# Patient Record
Sex: Female | Born: 1990 | Race: White | Hispanic: No | Marital: Single | Smoking: Former smoker
Health system: Southern US, Community
[De-identification: ages and names within clinical notes are randomized; demographics above are authoritative.]

## PROBLEM LIST (undated history)

## (undated) ENCOUNTER — Inpatient Hospital Stay (HOSPITAL_COMMUNITY): Payer: Self-pay

## (undated) DIAGNOSIS — N7011 Chronic salpingitis: Secondary | ICD-10-CM

## (undated) DIAGNOSIS — R519 Headache, unspecified: Secondary | ICD-10-CM

## (undated) DIAGNOSIS — O24419 Gestational diabetes mellitus in pregnancy, unspecified control: Secondary | ICD-10-CM

## (undated) DIAGNOSIS — E282 Polycystic ovarian syndrome: Secondary | ICD-10-CM

## (undated) DIAGNOSIS — R51 Headache: Secondary | ICD-10-CM

## (undated) DIAGNOSIS — F909 Attention-deficit hyperactivity disorder, unspecified type: Secondary | ICD-10-CM

## (undated) HISTORY — DX: Gestational diabetes mellitus in pregnancy, unspecified control: O24.419

## (undated) HISTORY — PX: TONSILLECTOMY: SUR1361

---

## 2011-07-14 ENCOUNTER — Ambulatory Visit (INDEPENDENT_AMBULATORY_CARE_PROVIDER_SITE_OTHER): Payer: BC Managed Care – PPO

## 2011-07-14 DIAGNOSIS — J019 Acute sinusitis, unspecified: Secondary | ICD-10-CM

## 2011-07-14 DIAGNOSIS — J209 Acute bronchitis, unspecified: Secondary | ICD-10-CM

## 2011-07-14 DIAGNOSIS — F17200 Nicotine dependence, unspecified, uncomplicated: Secondary | ICD-10-CM

## 2011-12-13 ENCOUNTER — Ambulatory Visit (INDEPENDENT_AMBULATORY_CARE_PROVIDER_SITE_OTHER): Payer: BC Managed Care – PPO | Admitting: Emergency Medicine

## 2011-12-13 VITALS — BP 104/63 | HR 66 | Temp 98.4°F | Resp 18 | Ht 62.0 in | Wt 160.6 lb

## 2011-12-13 DIAGNOSIS — G8929 Other chronic pain: Secondary | ICD-10-CM

## 2011-12-13 DIAGNOSIS — R112 Nausea with vomiting, unspecified: Secondary | ICD-10-CM

## 2011-12-13 DIAGNOSIS — R1011 Right upper quadrant pain: Secondary | ICD-10-CM

## 2011-12-13 LAB — POCT URINALYSIS DIPSTICK
Bilirubin, UA: NEGATIVE
Blood, UA: NEGATIVE
Glucose, UA: NEGATIVE
Ketones, UA: NEGATIVE
Nitrite, UA: NEGATIVE
pH, UA: 7

## 2011-12-13 LAB — POCT UA - MICROSCOPIC ONLY
Bacteria, U Microscopic: NEGATIVE
Casts, Ur, LPF, POC: NEGATIVE
Crystals, Ur, HPF, POC: NEGATIVE
Mucus, UA: NEGATIVE
RBC, urine, microscopic: NEGATIVE

## 2011-12-13 MED ORDER — ONDANSETRON 4 MG PO TBDP: 4.0000 mg | ORAL_TABLET | Freq: Three times a day (TID) | ORAL | Status: AC | PRN

## 2011-12-13 NOTE — Progress Notes (Signed)
  Subjective:    Patient ID: Angela Willis, female    DOB: 1990-07-16, 21 y.o.   MRN: [id number]  Abdominal Pain This is a recurrent problem. The current episode started more than 1 month ago. The onset quality is gradual. The problem occurs daily. The problem has been gradually worsening. The pain is located in the RUQ. The pain is mild. The quality of the pain is colicky. The abdominal pain does not radiate. Associated symptoms include nausea and vomiting. Pertinent negatives include no anorexia, arthralgias, belching, constipation, diarrhea, dysuria, fever, flatus, frequency, headaches, hematochezia, hematuria, melena, myalgias or weight loss. The pain is aggravated by eating. The pain is relieved by nothing. She has tried nothing for the symptoms. There is no history of abdominal surgery, colon cancer, Crohn's disease, gallstones, GERD, irritable bowel syndrome, pancreatitis, PUD or ulcerative colitis.  Emesis  Associated symptoms include abdominal pain. Pertinent negatives include no arthralgias, diarrhea, fever, headaches, myalgias or weight loss.      Review of Systems  Constitutional: Negative.  Negative for fever and weight loss.  HENT: Negative.   Eyes: Negative.   Respiratory: Negative.   Cardiovascular: Negative.   Gastrointestinal: Positive for nausea, vomiting and abdominal pain. Negative for diarrhea, constipation, melena, hematochezia, anorexia and flatus.  Genitourinary: Negative.  Negative for dysuria, frequency and hematuria.  Musculoskeletal: Negative.  Negative for myalgias and arthralgias.  Neurological: Negative for headaches.       Objective:   Physical Exam  Nursing note and vitals reviewed. Constitutional: She is oriented to person, place, and time. She appears well-developed and well-nourished.  HENT:  Head: Normocephalic and atraumatic.  Left Ear: External ear normal.  Mouth/Throat: Oropharynx is clear and moist.  Eyes: Conjunctivae and EOM are normal.  Pupils are equal, round, and reactive to light. No scleral icterus.  Neck: Normal range of motion. Neck supple.  Cardiovascular: Normal rate, regular rhythm and normal heart sounds.   Pulmonary/Chest: Effort normal.  Abdominal: Soft. There is tenderness (murphy positive).  Musculoskeletal: Normal range of motion.  Neurological: She is alert and oriented to person, place, and time.  Skin: Skin is warm and dry.          Assessment & Plan:  Will obtain gb ultrasound   Counseled regarding birth control.  Results for orders placed in visit on 12/13/11  POCT URINE PREGNANCY      Component Value Range   Preg Test, Ur Negative    POCT URINALYSIS DIPSTICK      Component Value Range   Color, UA yellow     Clarity, UA clear     Glucose, UA neg     Bilirubin, UA neg     Ketones, UA neg     Spec Grav, UA 1.015     Blood, UA neg     pH, UA 7.0     Protein, UA neg     Urobilinogen, UA 0.2     Nitrite, UA neg     Leukocytes, UA Negative    POCT UA - MICROSCOPIC ONLY      Component Value Range   WBC, Ur, HPF, POC 0-1     RBC, urine, microscopic neg     Bacteria, U Microscopic neg     Mucus, UA neg     Epithelial cells, urine per micros 0-4     Crystals, Ur, HPF, POC neg     Casts, Ur, LPF, POC neg     Yeast, UA neg

## 2011-12-19 ENCOUNTER — Ambulatory Visit
Admission: RE | Admit: 2011-12-19 | Discharge: 2011-12-19 | Disposition: A | Discharge status: Home / Self Care | Payer: BC Managed Care – PPO | Source: Ambulatory Visit | Attending: Emergency Medicine | Admitting: Emergency Medicine

## 2011-12-19 ENCOUNTER — Other Ambulatory Visit: Payer: Self-pay | Admitting: Emergency Medicine

## 2011-12-19 DIAGNOSIS — R112 Nausea with vomiting, unspecified: Secondary | ICD-10-CM

## 2011-12-19 DIAGNOSIS — R1011 Right upper quadrant pain: Secondary | ICD-10-CM

## 2011-12-19 DIAGNOSIS — G8929 Other chronic pain: Secondary | ICD-10-CM

## 2011-12-26 ENCOUNTER — Other Ambulatory Visit: Payer: Self-pay | Admitting: Emergency Medicine

## 2011-12-26 ENCOUNTER — Telehealth: Payer: Self-pay

## 2011-12-26 NOTE — Telephone Encounter (Signed)
Pt is requesting the results of her scan Dr. Dareen Piano ordered.  Please call

## 2011-12-26 NOTE — Telephone Encounter (Signed)
Dr Dareen Piano, can you please comment on pt's [place] results and if you want any f/up, etc?

## 2011-12-27 ENCOUNTER — Telehealth: Payer: Self-pay

## 2011-12-27 NOTE — Telephone Encounter (Signed)
Dr Dareen Piano, can you please review pt's labs so that she can be called?

## 2011-12-27 NOTE — Telephone Encounter (Signed)
PT ANXIOUS ABOUT TEST RESULTIS,WAS TOLD SHE WOULD RECEIVE CALL YESTERDAY   BEST PHONE [phone]

## 2016-08-29 ENCOUNTER — Ambulatory Visit (INDEPENDENT_AMBULATORY_CARE_PROVIDER_SITE_OTHER): Payer: Commercial Managed Care - HMO

## 2016-08-29 ENCOUNTER — Ambulatory Visit (INDEPENDENT_AMBULATORY_CARE_PROVIDER_SITE_OTHER): Payer: Commercial Managed Care - HMO | Admitting: Family Medicine

## 2016-08-29 VITALS — BP 122/80 | HR 86 | Temp 99.9°F | Resp 18 | Ht 62.0 in | Wt 178.0 lb

## 2016-08-29 DIAGNOSIS — M25562 Pain in left knee: Secondary | ICD-10-CM

## 2016-08-29 DIAGNOSIS — T07XXXA Unspecified multiple injuries, initial encounter: Secondary | ICD-10-CM

## 2016-08-29 DIAGNOSIS — M79642 Pain in left hand: Secondary | ICD-10-CM

## 2016-08-29 NOTE — Patient Instructions (Addendum)
Continue to cleanse abrasions with soap and water, Polysporin or Neosporin if needed to areas and watch for signs of infection as we discussed. If new areas of pain over the next few days that were not discussed today, or any worsening of pain in areas, return to recheck.  Tylenol over-the-counter or occasional Advil okay if needed for pain., Ice over affected areas up to 10 minutes to 15 minutes at a time if needed.   Return to the clinic or go to the nearest emergency room if any of your symptoms worsen or new symptoms occur.   Abrasion An abrasion is a cut or scrape on the outer surface of your skin. An abrasion does not extend through all of the layers of your skin. It is important to care for your abrasion properly to prevent infection. What are the causes? Most abrasions are caused by falling on or gliding across the ground or another surface. When your skin rubs on something, the outer and inner layer of skin rubs off. What are the signs or symptoms? A cut or scrape is the main symptom of this condition. The scrape may be bleeding, or it may appear red or pink. If there was an associated fall, there may be an underlying bruise. How is this diagnosed? An abrasion is diagnosed with a physical exam. How is this treated? Treatment for this condition depends on how large and deep the abrasion is. Usually, your abrasion will be cleaned with water and mild soap. This removes any dirt or debris that may be stuck. An antibiotic ointment may be applied to the abrasion to help prevent infection. A bandage (dressing) may be placed on the abrasion to keep it clean. You may also need a tetanus shot. Follow these instructions at home: Medicines   Take or apply medicines only as directed by your health care provider.  If you were prescribed an antibiotic ointment, finish all of it even if you start to feel better. Wound care   Clean the wound with mild soap and water 2-3 times per day or as  directed by your health care provider. Pat your wound dry with a clean towel. Do not rub it.  There are many different ways to close and cover a wound. Follow instructions from your health care provider about:  Wound care.  Dressing changes and removal.  Check your wound every day for signs of infection. Watch for:  Redness, swelling, or pain.  Fluid, blood, or pus. General instructions    Keep the dressing dry as directed by your health care provider. Do not take baths, swim, use a hot tub, or do anything that would put your wound underwater until your health care provider approves.  If there is swelling, raise (elevate) the injured area above the level of your heart while you are sitting or lying down.  Keep all follow-up visits as directed by your health care provider. This is important. Contact a health care provider if:  You received a tetanus shot and you have swelling, severe pain, redness, or bleeding at the injection site.  Your pain is not controlled with medicine.  You have increased redness, swelling, or pain at the site of your wound. Get help right away if:  You have a red streak going away from your wound.  You have a fever.  You have fluid, blood, or pus coming from your wound.  You notice a bad smell coming from your wound or your dressing. This information is not intended  to replace advice given to you by your health care provider. Make sure you discuss any questions you have with your health care provider. Document Released: 03/23/2005 Document Revised: 02/12/2016 Document Reviewed: 06/11/2014 Elsevier Interactive Patient Education  2017 [employer].    IF you received an x-ray today, you will receive an invoice from [facility]. Please contact [facility] at [phone] with questions or concerns regarding your invoice.   IF you received labwork today, you will receive an invoice from [facility]. Please contact [facility] at  [phone] with questions or concerns regarding your invoice.   Our billing staff will not be able to assist you with questions regarding bills from these companies.  You will be contacted with the lab results as soon as they are available. The fastest way to get your results is to activate your My Chart account. Instructions are located on the last page of this paperwork. If you have not heard from [place] regarding the results in 2 weeks, please contact this office.

## 2016-08-29 NOTE — Progress Notes (Signed)
Subjective:  By signing my name below, I, Essence Howell, attest that this documentation has been prepared under the direction and in the presence of Shade Flood, MD Electronically Signed: Charline Bills, ED Scribe 08/29/2016 at 2:32 PM.   Patient ID: Angela Willis, female    DOB: [birth year], 26 y.o.   MRN: [id number]  Chief Complaint  Patient presents with  . Hand Injury    INJURED LEFT HAND BREAKING UP DOGS  . Arm Pain    RIGHT ARM TOWARDS ELBOW   HPI Angela Willis is a 26 y.o. female who presents to Primary Care at [facility] complaining of a hand injury sustained yesterday. 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 Pt does not recall feeling a pop, snap or click in her left hand during the incident. She reports multiple abrasions to her right left hand, right hand, right forearm, both knees as well as swelling and bruising to her left hand. Pt was treated with saline by EMS on the scene. No active bleeding at this time. She has applied neosporin to the areas. Pt is right hand dominant.   There are no active problems to display for this patient.  History reviewed. No pertinent past medical history. History reviewed. No pertinent surgical history. Allergies  Allergen Reactions  . Hydrocodone Nausea Only   Prior to Admission medications   Not on File   Social History   Social History  . Marital status: Single    Spouse name: N/A  . Number of children: N/A  . Years of education: N/A   Occupational History  . Not on file.   Social History Main Topics  . Smoking status: Current Every Day Smoker    Packs/day: 3.00    Types: Cigarettes  . Smokeless tobacco: Never Used  . Alcohol use No  . Drug use: No  . Sexual activity: Yes   Other Topics Concern  . Not on file   Social History Narrative  . No narrative on file   Review of Systems  Musculoskeletal: Positive  for joint swelling.  Skin: Positive for color change and wound.      Objective:   Physical Exam  Constitutional: She is oriented to person, place, and time. She appears well-developed and well-nourished. No distress.  HENT:  Head: Normocephalic and atraumatic.  Eyes: Conjunctivae and EOM are normal.  Neck: Neck supple. No tracheal deviation present.  Cardiovascular: Normal rate.   Pulmonary/Chest: Effort normal. No respiratory distress.  Musculoskeletal: Normal range of motion.  Tenderness at the base 4th MCP of the L hand. Flexion and extension intact of the digits. Remainder of hand and wrist without bony tenderness.  FROM, pain free at L and R wrist. FROM, pain free at bilateral elbows. Full grip strength on R hand without bony tenderness. Superficial abrasion noted over the R forearm. L ulnar aspect of wrist and 4th and 5th dorsal fingers R hand small abrasions L grater than R anterior knees. R knee: no effusion. Skin intact other than superificial abrasion. Negative varus and valgus. L knee: FROM. Medial anterior joint line tenderness but no effusion. Negative Lachman.  Neurological: She is alert and oriented to person, place, and time.  Skin: Skin is warm and dry.  Psychiatric: She has a normal mood and affect. Her behavior is normal.  Nursing note and vitals reviewed.  Vitals:   08/29/16 1420  BP:  122/80  Pulse: 86  Resp: 18  Temp: 99.9 F (37.7 C)  TempSrc: Oral  SpO2: 97%  Weight: 178 lb (80.7 kg)  Height: 5\' 2"  (1.575 m)   Dg Knee Complete 4 Views Left  Result Date: 08/29/2016 CLINICAL DATA:  Medial left knee pain after a fall yesterday. EXAM: LEFT KNEE - COMPLETE 4+ VIEW COMPARISON:  None. FINDINGS: No evidence of fracture, dislocation, or joint effusion. No evidence of arthropathy or other focal bone abnormality. Soft tissues are unremarkable. IMPRESSION: Negative. Electronically Signed   By: [name] M.D.   On: 08/29/2016 15:12   Dg Hand Complete Left  Result  Date: 08/29/2016 CLINICAL DATA:  Fourth metacarpal pain after fall yesterday. EXAM: LEFT HAND - COMPLETE 3+ VIEW COMPARISON:  None. FINDINGS: There is no evidence of fracture or dislocation. There is no evidence of arthropathy or other focal bone abnormality. Mild dorsal hand soft tissue swelling without subcutaneous gas or radiopaque foreign bodies. IMPRESSION: Soft tissue swelling without acute osseous process. Electronically Signed   By: Awilda Metro M.D.   On: 08/29/2016 15:13      Assessment & Plan:   Angela Willis is a 26 y.o. female Abrasion, multiple sites  Left hand pain - Plan: DG Hand Complete Left  Acute pain of left knee - Plan: DG Knee Complete 4 Views Left  Abrasions of multiple areas of left wrist/hand, right hand and right forearm, without signs of infection. Possible contusion versus strain of MCP at left hand without apparent fracture. Flexion and extension intact throughout all digits, and overall reassuring exam. Knee x-ray without acute fracture, likely contusion with overlying abrasions.  -Symptomatic care discussed with soap and water cleansing, Polysporin or Neosporin to abrasions and handout given on abrasions.  -Over-the-counter NSAID if needed, ice over the contused areas as needed for the next few days, and RTC precautions discussed if any worsening.  No orders of the defined types were placed in this encounter.  Patient Instructions    Continue to cleanse abrasions with soap and water, Polysporin or Neosporin if needed to areas and watch for signs of infection as we discussed. If new areas of pain over the next few days that were not discussed today, or any worsening of pain in areas, return to recheck.  Tylenol over-the-counter or occasional Advil okay if needed for pain., Ice over affected areas up to 10 minutes to 15 minutes at a time if needed.   Return to the clinic or go to the nearest emergency room if any of your symptoms worsen or new symptoms  occur.   Abrasion An abrasion is a cut or scrape on the outer surface of your skin. An abrasion does not extend through all of the layers of your skin. It is important to care for your abrasion properly to prevent infection. What are the causes? Most abrasions are caused by falling on or gliding across the ground or another surface. When your skin rubs on something, the outer and inner layer of skin rubs off. What are the signs or symptoms? A cut or scrape is the main symptom of this condition. The scrape may be bleeding, or it may appear red or pink. If there was an associated fall, there may be an underlying bruise. How is this diagnosed? An abrasion is diagnosed with a physical exam. How is this treated? Treatment for this condition depends on how large and deep the abrasion is. Usually, your abrasion will be cleaned with water and mild soap. This  removes any dirt or debris that may be stuck. An antibiotic ointment may be applied to the abrasion to help prevent infection. A bandage (dressing) may be placed on the abrasion to keep it clean. You may also need a tetanus shot. Follow these instructions at home: Medicines   Take or apply medicines only as directed by your health care provider.  If you were prescribed an antibiotic ointment, finish all of it even if you start to feel better. Wound care   Clean the wound with mild soap and water 2-3 times per day or as directed by your health care provider. Pat your wound dry with a clean towel. Do not rub it.  There are many different ways to close and cover a wound. Follow instructions from your health care provider about:  Wound care.  Dressing changes and removal.  Check your wound every day for signs of infection. Watch for:  Redness, swelling, or pain.  Fluid, blood, or pus. General instructions    Keep the dressing dry as directed by your health care provider. Do not take baths, swim, use a hot tub, or do anything that would  put your wound underwater until your health care provider approves.  If there is swelling, raise (elevate) the injured area above the level of your heart while you are sitting or lying down.  Keep all follow-up visits as directed by your health care provider. This is important. Contact a health care provider if:  You received a tetanus shot and you have swelling, severe pain, redness, or bleeding at the injection site.  Your pain is not controlled with medicine.  You have increased redness, swelling, or pain at the site of your wound. Get help right away if:  You have a red streak going away from your wound.  You have a fever.  You have fluid, blood, or pus coming from your wound.  You notice a bad smell coming from your wound or your dressing. This information is not intended to replace advice given to you by your health care provider. Make sure you discuss any questions you have with your health care provider. Document Released: 03/23/2005 Document Revised: 02/12/2016 Document Reviewed: 06/11/2014 Elsevier Interactive Patient Education  2017 [employer]    IF you received an x-ray today, you will receive [facility]. Please contact [facility] at [phone] with questions or concerns regarding your invoice.   IF you received labwork today, you will receive an invoice from [facility]. Please contact [facility] at [phone] with questions or concerns regarding your invoice.   Our billing staff will not be able to assist you with questions regarding bills from these companies.  You will be contacted with the lab results as soon as they are available. The fastest way to get your results is to activate your My Chart account. Instructions are located on the last page of this paperwork. If you have not heard from us regarding the results in 2 weeks, please contact this office.      I personally performed the services described in this  documentation, which was scribed in my presence. The recorded information has been reviewed and considered for accuracy and completeness, addended by me as needed, and agree with information above.  Signed,   Meredith StaggersJeffrey Azhia Siefken, MD Primary Care at [facility].  08/31/16 11:38 AM

## 2017-03-27 ENCOUNTER — Other Ambulatory Visit (HOSPITAL_COMMUNITY): Payer: Self-pay | Admitting: Obstetrics and Gynecology

## 2017-03-27 DIAGNOSIS — N979 Female infertility, unspecified: Secondary | ICD-10-CM

## 2017-03-31 ENCOUNTER — Ambulatory Visit (HOSPITAL_COMMUNITY)
Admission: RE | Admit: 2017-03-31 | Discharge: 2017-03-31 | Disposition: A | Discharge status: Home / Self Care | Payer: BLUE CROSS/BLUE SHIELD | Source: Ambulatory Visit | Attending: Obstetrics and Gynecology | Admitting: Obstetrics and Gynecology

## 2017-03-31 DIAGNOSIS — N979 Female infertility, unspecified: Secondary | ICD-10-CM | POA: Insufficient documentation

## 2017-03-31 MED ORDER — IOPAMIDOL (ISOVUE-300) INJECTION 61%
30.0000 mL | Freq: Once | INTRAVENOUS | Status: AC | PRN
  Administered 2017-03-31: 11 mL

## 2017-06-30 IMAGING — DX DG KNEE COMPLETE 4+V*L*
4 series · 4 of 4 positions shown · non-contrast
Comparison: None.

CLINICAL DATA: Medial left knee pain after a fall yesterday.

EXAM:
LEFT KNEE - COMPLETE 4+ VIEW

[knee ap]
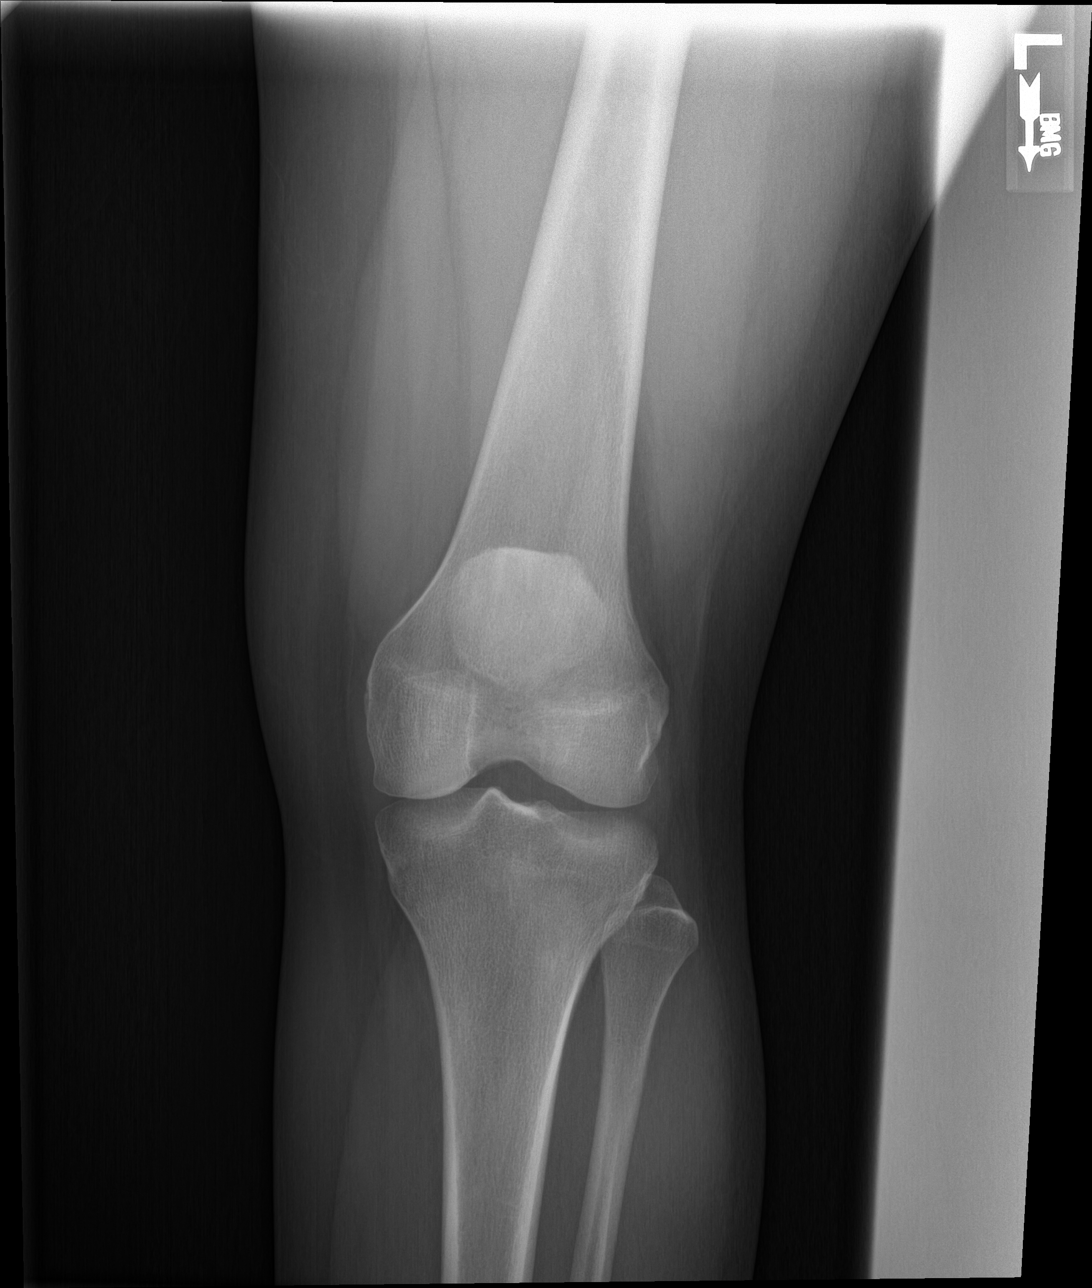

[knee lat]
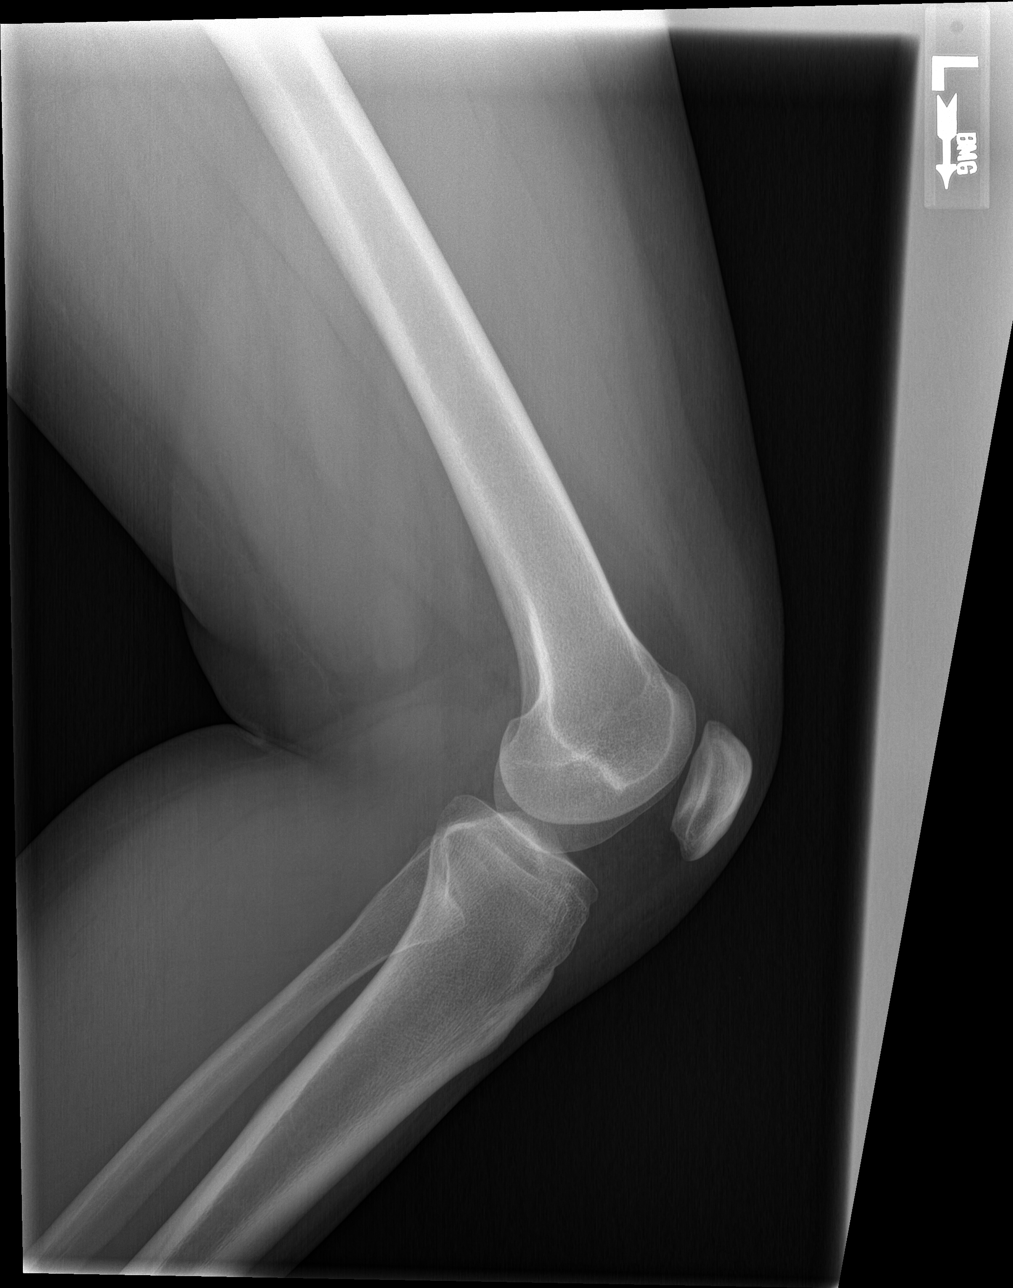

[sunrise]
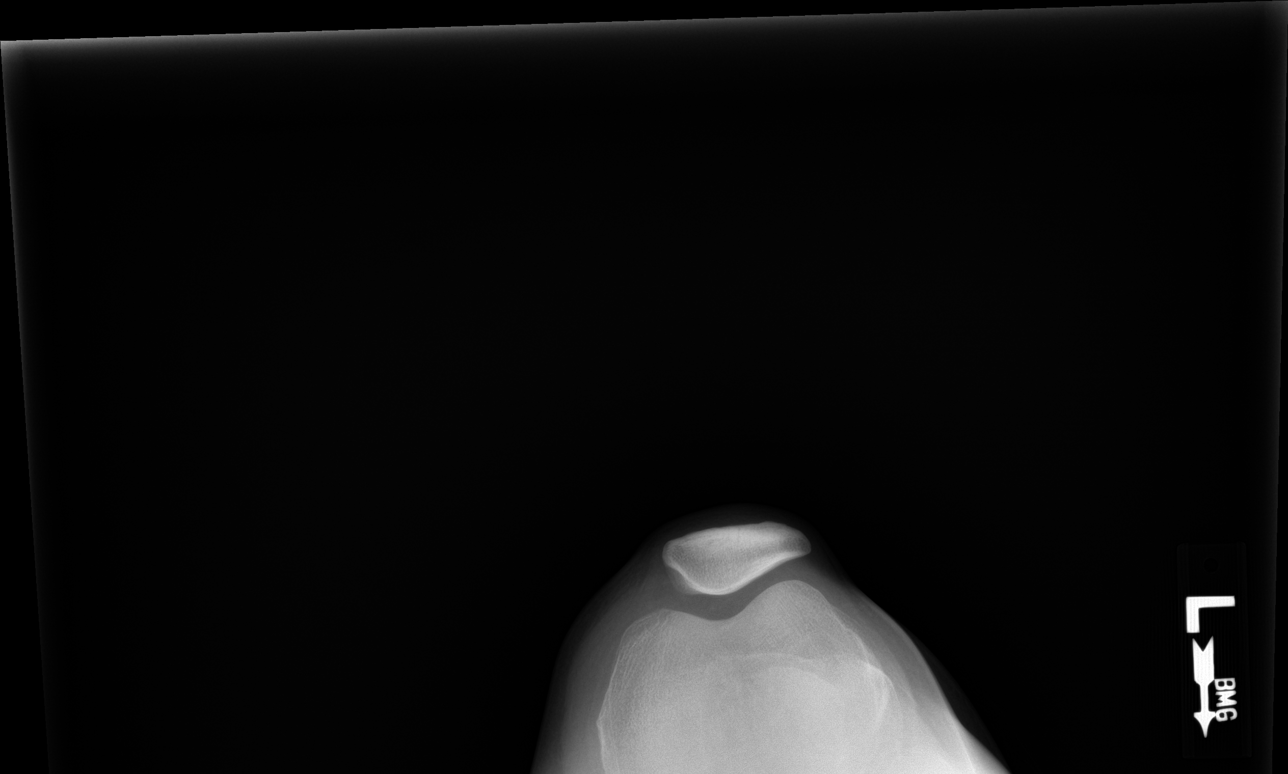

[knee [person_name]]
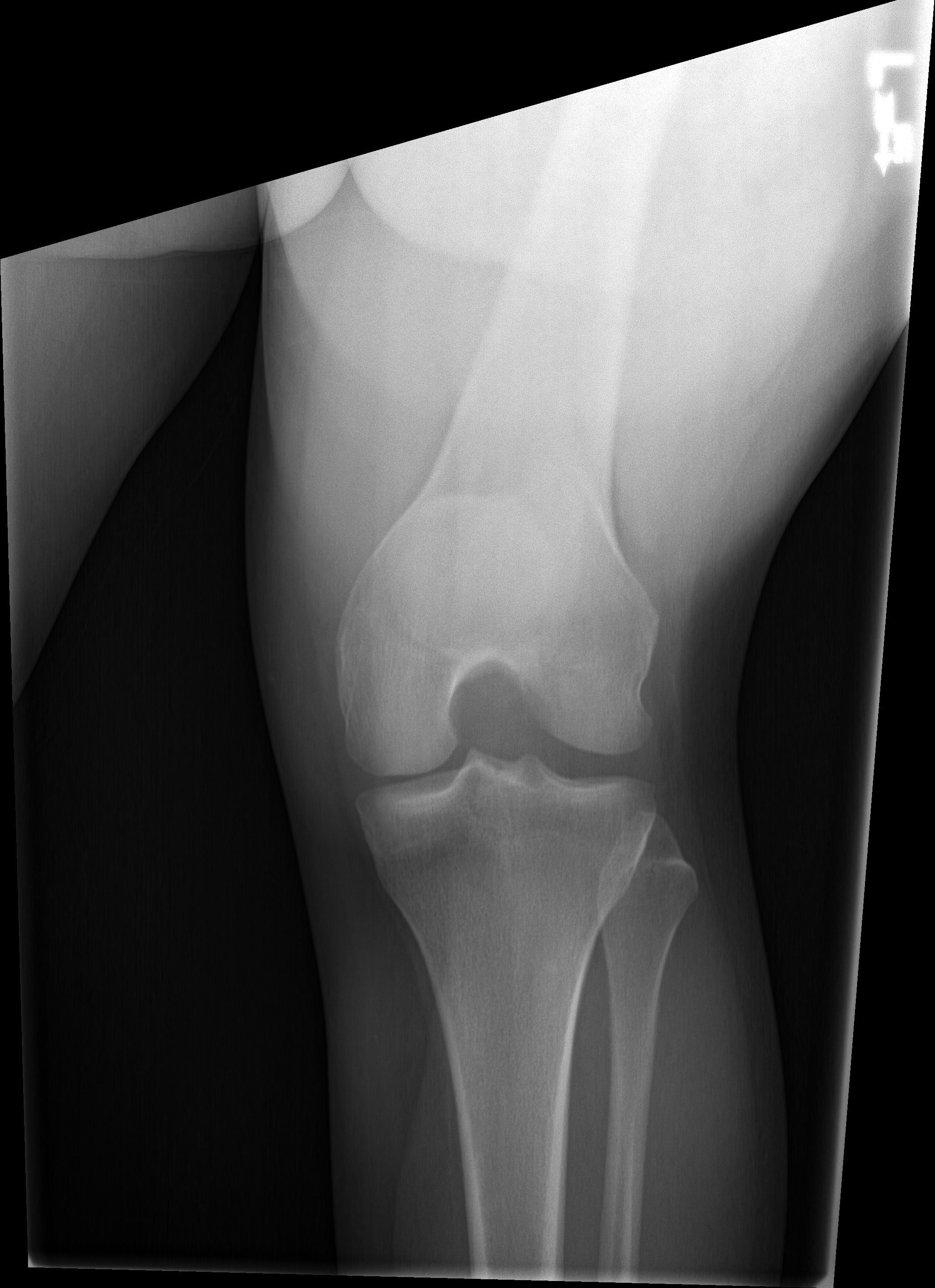

[4 of 4 positions shown; findings below may reference images not displayed]

FINDINGS: No evidence of fracture, dislocation, or joint effusion. No evidence
of arthropathy or other focal bone abnormality. Soft tissues are
unremarkable.
IMPRESSION: Negative.

## 2017-09-21 ENCOUNTER — Encounter (HOSPITAL_BASED_OUTPATIENT_CLINIC_OR_DEPARTMENT_OTHER): Payer: Self-pay | Admitting: Emergency Medicine

## 2017-09-21 ENCOUNTER — Other Ambulatory Visit: Payer: Self-pay

## 2017-09-21 NOTE — Progress Notes (Signed)
SPOKE WITH: patient  RIDING HOME WITH: mother Misty StanleyLisa [phone] AM MEDICATIONS: none  NPO STATUS: npo after midnight including no mint gum candy  LABS: needs hemoglobin and urine pregnancy DOS COMMENTS/CONCERNS: n/a

## 2017-09-28 NOTE — H&P (Addendum)
Angela CantorBrittany Goswick is a 27 y.o. female , originally referred to me by Dr. Olivia Mackieichard Taavon, for L/S, L Hydrosalpinx possible salpingectomy.  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 Patient would like to preserve her childbearing potential.  Pertinent Gynecological History: Menses: flow is excessive with use of 3 pads or tampons on heaviest days Bleeding: dysfunctional uterine bleeding Contraception: none DES exposure: denies Blood transfusions: none Sexually transmitted diseases: no past history Previous GYN Procedures: none  Last pap: normal  OB History: G1P0010   Menstrual History: Menarche age: [age] No LMP recorded.    Past Medical History:  Diagnosis Date  . ADHD    dx in childhood   . Chronic salpingitis    left   . Headache    occ   . PCOS (polycystic ovarian syndrome)                     Past Surgical History:  Procedure Laterality Date  . TONSILLECTOMY  age 40              History reviewed. No pertinent family history. No hereditary disease.  No cancer of breast, ovary, uterus. No cutaneous leiomyomatosis or renal cell carcinoma.  Social History   Socioeconomic History  . Marital status: Single    Spouse name: Not on file  . Number of children: Not on file  . Years of education: Not on file  . Highest education level: Not on file  Occupational History  . Not on file  Social Needs  . Financial resource strain: Not on file  . Food insecurity:    Worry: Not on file    Inability: Not on file  . Transportation needs:    Medical: Not on file    Non-medical: Not on file  Tobacco Use  . Smoking status: Former Smoker    Packs/day: 3.00    Years: 4.00    Pack years: 12.00    Types: Cigarettes    Last attempt to quit: 09/22/2014    Years since quitting: 3.0  . Smokeless tobacco: Never Used  Substance and Sexual Activity  . Alcohol use: No  . Drug use: No  . Sexual activity: Yes   Lifestyle  . Physical activity:    Days per week: Not on file    Minutes per session: Not on file  . Stress: Not on file  Relationships  . Social connections:    Talks on phone: Not on file    Gets together: Not on file    Attends religious service: Not on file    Active member of club or organization: Not on file    Attends meetings of clubs or organizations: Not on file    Relationship status: Not on file  . Intimate partner violence:    Fear of current or ex partner: Not on file    Emotionally abused: Not on file    Physically abused: Not on file    Forced sexual activity: Not on file  Other Topics Concern  . Not on file  Social History Narrative  . Not on file    Allergies  Allergen Reactions  . Hydrocodone Nausea Only    N/V with cough syrup   . Tessalon Perles [Benzonatate]     N/V     No current facility-administered medications on file prior to encounter.    Current Outpatient Medications on File Prior to Encounter  Medication Sig Dispense Refill  . letrozole (FEMARA) 2.5 MG tablet Take 2.5 mg by mouth daily.    . metFORMIN (GLUCOPHAGE) 1000 MG tablet Take 1,000 mg by mouth 2 (two) times daily with a meal.    . Prenatal MV & Min w/FA-DHA ([employer] PRENATAL GUMMY PO) Take by mouth. Takes 2 gummies once a day       Review of Systems  Constitutional: Negative.   HENT: Negative.   Eyes: Negative.   Respiratory: Negative.   Cardiovascular: Negative.   Gastrointestinal: Negative.   Genitourinary: Negative.   Musculoskeletal: Negative.   Skin: Negative.   Neurological: Negative.   Endo/Heme/Allergies: Negative.   Psychiatric/Behavioral: Negative.      Physical Exam  Ht 5\' 3"  (1.6 m)   Wt 81.6 kg (180 lb)   LMP 09/13/2017 (Approximate)   BMI 31.89 kg/m  Constitutional: She is oriented to person, place, and time. She appears well-developed and well-nourished.  HENT:  Head: Normocephalic and atraumatic.  Nose: Nose normal.  Mouth/Throat: Oropharynx is  clear and moist. No oropharyngeal exudate.  Eyes: Conjunctivae normal and EOM are normal. Pupils are equal, round, and reactive to light. No scleral icterus.  Neck: Normal range of motion. Neck supple. No tracheal deviation present. No thyromegaly present.  Cardiovascular: Normal rate.   Respiratory: Effort normal and breath sounds normal.  GI: Soft. Bowel sounds are normal. She exhibits no distension and no mass. There is no tenderness.  Lymphadenopathy:    She has no cervical adenopathy.  Neurological: She is alert and oriented to person, place, and time. She has normal reflexes.  Skin: Skin is warm.  Psychiatric: She has a normal mood and affect. Her behavior is normal. Judgment and thought content normal.     Assessment/Plan:  Left  hydrosalpinx Preoperative for L/S, possible left salpingectomy Benefits and risks of L/S, possible salpingectomy were discussed with the patient and her family member again.  Bowel prep instructions were given.  All of patient's questions were answered.  She verbalized understanding. Marland Kitchen

## 2017-09-28 NOTE — Progress Notes (Signed)
Pt informed of time change for her procedure.  New arrival time is 0900 w OR start time of 1100. .same instructions as before. Pt verbalized her understanding.

## 2017-09-29 ENCOUNTER — Encounter (HOSPITAL_BASED_OUTPATIENT_CLINIC_OR_DEPARTMENT_OTHER)
Admission: RE | Disposition: A | Discharge status: Home / Self Care | Payer: Self-pay | Source: Ambulatory Visit | Attending: Obstetrics and Gynecology

## 2017-09-29 ENCOUNTER — Other Ambulatory Visit: Payer: Self-pay

## 2017-09-29 ENCOUNTER — Encounter (HOSPITAL_BASED_OUTPATIENT_CLINIC_OR_DEPARTMENT_OTHER): Payer: Self-pay | Admitting: *Deleted

## 2017-09-29 ENCOUNTER — Ambulatory Visit (HOSPITAL_BASED_OUTPATIENT_CLINIC_OR_DEPARTMENT_OTHER)
Admission: RE | Admit: 2017-09-29 | Discharge: 2017-09-29 | Disposition: A | Discharge status: Home / Self Care | Payer: BLUE CROSS/BLUE SHIELD | Source: Ambulatory Visit | Attending: Obstetrics and Gynecology | Admitting: Obstetrics and Gynecology

## 2017-09-29 ENCOUNTER — Ambulatory Visit (HOSPITAL_BASED_OUTPATIENT_CLINIC_OR_DEPARTMENT_OTHER): Payer: BLUE CROSS/BLUE SHIELD | Admitting: Anesthesiology

## 2017-09-29 DIAGNOSIS — R102 Pelvic and perineal pain: Secondary | ICD-10-CM | POA: Insufficient documentation

## 2017-09-29 DIAGNOSIS — Z79811 Long term (current) use of aromatase inhibitors: Secondary | ICD-10-CM | POA: Insufficient documentation

## 2017-09-29 DIAGNOSIS — N803 Endometriosis of pelvic peritoneum: Secondary | ICD-10-CM | POA: Diagnosis not present

## 2017-09-29 DIAGNOSIS — Z7984 Long term (current) use of oral hypoglycemic drugs: Secondary | ICD-10-CM | POA: Insufficient documentation

## 2017-09-29 DIAGNOSIS — N979 Female infertility, unspecified: Secondary | ICD-10-CM | POA: Diagnosis not present

## 2017-09-29 DIAGNOSIS — N946 Dysmenorrhea, unspecified: Secondary | ICD-10-CM | POA: Insufficient documentation

## 2017-09-29 DIAGNOSIS — Z79899 Other long term (current) drug therapy: Secondary | ICD-10-CM | POA: Diagnosis not present

## 2017-09-29 DIAGNOSIS — Z87891 Personal history of nicotine dependence: Secondary | ICD-10-CM | POA: Diagnosis not present

## 2017-09-29 DIAGNOSIS — N736 Female pelvic peritoneal adhesions (postinfective): Secondary | ICD-10-CM | POA: Insufficient documentation

## 2017-09-29 DIAGNOSIS — N7011 Chronic salpingitis: Secondary | ICD-10-CM | POA: Diagnosis not present

## 2017-09-29 HISTORY — DX: Attention-deficit hyperactivity disorder, unspecified type: F90.9

## 2017-09-29 HISTORY — DX: Headache, unspecified: R51.9

## 2017-09-29 HISTORY — PX: LAPAROSCOPIC BILATERAL SALPINGECTOMY: SHX5889

## 2017-09-29 HISTORY — DX: Polycystic ovarian syndrome: E28.2

## 2017-09-29 HISTORY — DX: Chronic salpingitis: N70.11

## 2017-09-29 HISTORY — DX: Headache: R51

## 2017-09-29 LAB — POCT PREGNANCY, URINE: PREG TEST UR: NEGATIVE

## 2017-09-29 LAB — POCT HEMOGLOBIN-HEMACUE: Hemoglobin: 12.2 g/dL (ref 12.0–15.0)

## 2017-09-29 SURGERY — SALPINGECTOMY, BILATERAL, LAPAROSCOPIC
Anesthesia: General | Laterality: Left

## 2017-09-29 MED ORDER — ROCURONIUM BROMIDE 100 MG/10ML IV SOLN
INTRAVENOUS | Status: DC | PRN
  Administered 2017-09-29: 10 mg via INTRAVENOUS
  Administered 2017-09-29: 40 mg via INTRAVENOUS

## 2017-09-29 MED ORDER — DEXAMETHASONE SODIUM PHOSPHATE 10 MG/ML IJ SOLN
INTRAMUSCULAR | Status: AC
  Filled 2017-09-29: qty 1

## 2017-09-29 MED ORDER — FENTANYL CITRATE (PF) 100 MCG/2ML IJ SOLN
INTRAMUSCULAR | Status: DC | PRN
  Administered 2017-09-29 (×4): 25 ug via INTRAVENOUS
  Administered 2017-09-29 (×2): 50 ug via INTRAVENOUS

## 2017-09-29 MED ORDER — ONDANSETRON HCL 4 MG/2ML IJ SOLN
INTRAMUSCULAR | Status: DC | PRN
  Administered 2017-09-29: 4 mg via INTRAVENOUS

## 2017-09-29 MED ORDER — PROPOFOL 10 MG/ML IV BOLUS
INTRAVENOUS | Status: AC
  Filled 2017-09-29: qty 40

## 2017-09-29 MED ORDER — BUPIVACAINE-EPINEPHRINE 0.25% -1:200000 IJ SOLN
INTRAMUSCULAR | Status: DC | PRN
  Administered 2017-09-29: 10 mL

## 2017-09-29 MED ORDER — FENTANYL CITRATE (PF) 100 MCG/2ML IJ SOLN
INTRAMUSCULAR | Status: AC
  Filled 2017-09-29: qty 2

## 2017-09-29 MED ORDER — OXYCODONE-ACETAMINOPHEN 7.5-325 MG PO TABS: 1.0000 | ORAL_TABLET | ORAL | 0 refills | Status: DC | PRN

## 2017-09-29 MED ORDER — HYDROMORPHONE HCL 1 MG/ML IJ SOLN
INTRAMUSCULAR | Status: AC
  Filled 2017-09-29: qty 1

## 2017-09-29 MED ORDER — ACETAMINOPHEN 500 MG PO TABS
1000.0000 mg | ORAL_TABLET | Freq: Once | ORAL | Status: DC
  Filled 2017-09-29: qty 2

## 2017-09-29 MED ORDER — KETOROLAC TROMETHAMINE 30 MG/ML IJ SOLN
INTRAMUSCULAR | Status: AC
  Filled 2017-09-29: qty 1

## 2017-09-29 MED ORDER — HYDROMORPHONE HCL 1 MG/ML IJ SOLN
0.2500 mg | INTRAMUSCULAR | Status: DC | PRN
  Administered 2017-09-29 (×4): 0.25 mg via INTRAVENOUS
  Filled 2017-09-29: qty 0.5

## 2017-09-29 MED ORDER — SUGAMMADEX SODIUM 200 MG/2ML IV SOLN
INTRAVENOUS | Status: AC
  Filled 2017-09-29: qty 2

## 2017-09-29 MED ORDER — MIDAZOLAM HCL 5 MG/5ML IJ SOLN
INTRAMUSCULAR | Status: DC | PRN
  Administered 2017-09-29: 2 mg via INTRAVENOUS

## 2017-09-29 MED ORDER — DEXAMETHASONE SODIUM PHOSPHATE 4 MG/ML IJ SOLN
INTRAMUSCULAR | Status: DC | PRN
  Administered 2017-09-29: 10 mg via INTRAVENOUS

## 2017-09-29 MED ORDER — SCOPOLAMINE 1 MG/3DAYS TD PT72
1.0000 | MEDICATED_PATCH | Freq: Once | TRANSDERMAL | Status: DC
  Filled 2017-09-29: qty 1

## 2017-09-29 MED ORDER — METHYLENE BLUE 0.5 % INJ SOLN
INTRAVENOUS | Status: DC | PRN
  Administered 2017-09-29: 1 mL via SUBMUCOSAL

## 2017-09-29 MED ORDER — ONDANSETRON HCL 4 MG/2ML IJ SOLN
INTRAMUSCULAR | Status: AC
  Filled 2017-09-29: qty 2

## 2017-09-29 MED ORDER — LACTATED RINGERS IV SOLN
INTRAVENOUS | Status: DC
  Administered 2017-09-29 (×2): via INTRAVENOUS
  Filled 2017-09-29: qty 1000

## 2017-09-29 MED ORDER — LIDOCAINE HCL (CARDIAC) 20 MG/ML IV SOLN
INTRAVENOUS | Status: DC | PRN
  Administered 2017-09-29: 100 mg via INTRAVENOUS

## 2017-09-29 MED ORDER — LIDOCAINE 2% (20 MG/ML) 5 ML SYRINGE
INTRAMUSCULAR | Status: AC
  Filled 2017-09-29: qty 5

## 2017-09-29 MED ORDER — KETOROLAC TROMETHAMINE 30 MG/ML IJ SOLN
INTRAMUSCULAR | Status: DC | PRN
  Administered 2017-09-29: 30 mg via INTRAVENOUS

## 2017-09-29 MED ORDER — PROMETHAZINE HCL 25 MG/ML IJ SOLN
6.2500 mg | INTRAMUSCULAR | Status: DC | PRN
  Filled 2017-09-29: qty 1

## 2017-09-29 MED ORDER — ONDANSETRON HCL 4 MG PO TABS: 4.0000 mg | ORAL_TABLET | Freq: Three times a day (TID) | ORAL | 0 refills | Status: DC | PRN

## 2017-09-29 MED ORDER — ROCURONIUM BROMIDE 10 MG/ML (PF) SYRINGE
PREFILLED_SYRINGE | INTRAVENOUS | Status: AC
  Filled 2017-09-29: qty 5

## 2017-09-29 MED ORDER — PROPOFOL 10 MG/ML IV BOLUS
INTRAVENOUS | Status: DC | PRN
  Administered 2017-09-29: 200 mg via INTRAVENOUS
  Administered 2017-09-29: 20 mg via INTRAVENOUS
  Administered 2017-09-29: 30 mg via INTRAVENOUS

## 2017-09-29 MED ORDER — FAMOTIDINE 20 MG PO TABS
20.0000 mg | ORAL_TABLET | Freq: Once | ORAL | Status: DC
  Filled 2017-09-29: qty 1

## 2017-09-29 MED ORDER — SUGAMMADEX SODIUM 200 MG/2ML IV SOLN
INTRAVENOUS | Status: DC | PRN
  Administered 2017-09-29: 200 mg via INTRAVENOUS

## 2017-09-29 MED ORDER — CELECOXIB 200 MG PO CAPS
200.0000 mg | ORAL_CAPSULE | Freq: Once | ORAL | Status: DC | PRN
  Filled 2017-09-29: qty 1

## 2017-09-29 MED ORDER — MIDAZOLAM HCL 2 MG/2ML IJ SOLN
INTRAMUSCULAR | Status: AC
  Filled 2017-09-29: qty 2

## 2017-09-29 MED ORDER — CEFAZOLIN SODIUM-DEXTROSE 2-4 GM/100ML-% IV SOLN
INTRAVENOUS | Status: AC
  Filled 2017-09-29: qty 100

## 2017-09-29 MED ORDER — CEFAZOLIN SODIUM-DEXTROSE 2-4 GM/100ML-% IV SOLN
2.0000 g | INTRAVENOUS | Status: AC
  Administered 2017-09-29 (×2): 2 g via INTRAVENOUS
  Filled 2017-09-29: qty 100

## 2017-09-29 SURGICAL SUPPLY — 34 items
BAG RETRIEVAL 10MM (BASKET)
CABLE HIGH FREQUENCY MONO STRZ (ELECTRODE) ×3 IMPLANT
CLOTH BEACON ORANGE TIMEOUT ST (SAFETY) ×3 IMPLANT
CONT SPECI 4OZ STER CLIK (MISCELLANEOUS) IMPLANT
DERMABOND ADVANCED (GAUZE/BANDAGES/DRESSINGS) ×2
DERMABOND ADVANCED .7 DNX12 (GAUZE/BANDAGES/DRESSINGS) ×1 IMPLANT
DRSG COVADERM PLUS 2X2 (GAUZE/BANDAGES/DRESSINGS) IMPLANT
DRSG OPSITE POSTOP 3X4 (GAUZE/BANDAGES/DRESSINGS) IMPLANT
DURAPREP 26ML APPLICATOR (WOUND CARE) ×3 IMPLANT
ELECT REM PT RETURN 9FT ADLT (ELECTROSURGICAL) ×3
ELECTRODE REM PT RTRN 9FT ADLT (ELECTROSURGICAL) ×1 IMPLANT
GLOVE BIO SURGEON STRL SZ8 (GLOVE) ×6 IMPLANT
GOWN STRL REUS W/TWL LRG LVL3 (GOWN DISPOSABLE) ×3 IMPLANT
MANIPULATOR UTERINE 4.5 ZUMI (MISCELLANEOUS) ×3 IMPLANT
NEEDLE INSUFFLATION 120MM (ENDOMECHANICALS) ×3 IMPLANT
PACK LAPAROSCOPY BASIN (CUSTOM PROCEDURE TRAY) ×3 IMPLANT
PACK TRENDGUARD 450 HYBRID PRO (MISCELLANEOUS) ×1 IMPLANT
SEPRAFILM MEMBRANE 5X6 (MISCELLANEOUS) IMPLANT
SET IRRIG TUBING LAPAROSCOPIC (IRRIGATION / IRRIGATOR) IMPLANT
SUT MNCRL AB 4-0 PS2 18 (SUTURE) ×3 IMPLANT
SYR 30ML LL (SYRINGE) ×3 IMPLANT
SYR 50ML LL SCALE MARK (SYRINGE) IMPLANT
SYR 5ML LL (SYRINGE) ×3 IMPLANT
SYR CONTROL 10ML LL (SYRINGE) IMPLANT
SYS BAG RETRIEVAL 10MM (BASKET)
SYS RETRIEVAL 5MM INZII UNIV (BASKET) ×3
SYSTEM BAG RETRIEVAL 10MM (BASKET) IMPLANT
SYSTEM RETRIEVL 5MM INZII UNIV (BASKET) ×1 IMPLANT
TOWEL OR 17X24 6PK STRL BLUE (TOWEL DISPOSABLE) ×6 IMPLANT
TRAY FOLEY CATH SILVER 14FR (SET/KITS/TRAYS/PACK) IMPLANT
TRENDGUARD 450 HYBRID PRO PACK (MISCELLANEOUS) ×3
TROCAR OPTI TIP 5M 100M (ENDOMECHANICALS) ×6 IMPLANT
TUBING INSUF HEATED (TUBING) ×3 IMPLANT
WARMER LAPAROSCOPE (MISCELLANEOUS) ×3 IMPLANT

## 2017-09-29 NOTE — Anesthesia Preprocedure Evaluation (Signed)
Anesthesia Evaluation  Patient identified by MRN, date of birth, ID band Patient awake    Reviewed: Allergy & Precautions, NPO status , Patient's Chart, lab work & pertinent test results  Airway Mallampati: II  TM Distance: >3 FB Neck ROM: Full    Dental no notable dental hx.    Pulmonary former smoker,    Pulmonary exam normal breath sounds clear to auscultation       Cardiovascular negative cardio ROS Normal cardiovascular exam Rhythm:Regular Rate:Normal     Neuro/Psych  Headaches, PSYCHIATRIC DISORDERS ADHD    GI/Hepatic negative GI ROS, Neg liver ROS,   Endo/Other  PCOS (polycystic ovarian syndrome)  Renal/GU negative Renal ROS     Musculoskeletal negative musculoskeletal ROS (+)   Abdominal (+) + obese,   Peds  Hematology negative hematology ROS (+)   Anesthesia Other Findings HYDROSALPINX  Reproductive/Obstetrics                             Anesthesia Physical Anesthesia Plan  ASA: II  Anesthesia Plan: General   Post-op Pain Management:    Induction: Intravenous  PONV Risk Score and Plan: 3 and Ondansetron, Dexamethasone, Midazolam, Scopolamine patch - Pre-op and Treatment may vary due to age or medical condition  Airway Management Planned: Oral ETT  Additional Equipment:   Intra-op Plan:   Post-operative Plan: Extubation in OR  Informed Consent: I have reviewed the patients History and Physical, chart, labs and discussed the procedure including the risks, benefits and alternatives for the proposed anesthesia with the patient or authorized representative who has indicated his/her understanding and acceptance.   Dental advisory given  Plan Discussed with: CRNA  Anesthesia Plan Comments:         Anesthesia Quick Evaluation

## 2017-09-29 NOTE — Discharge Instructions (Signed)

## 2017-09-29 NOTE — Transfer of Care (Signed)
Immediate Anesthesia Transfer of Care Note  Patient: Angela Willis  Procedure(s) Performed: Procedure(s) (LRB): LAPAROSCOPIC LEFT SAPINGECTOMY, EXCISION AND ABLATION ENDOMETRIOSIS LYSIS OF ADHESIONS (Left)  Patient Location: PACU  Anesthesia Type: General  Level of Consciousness: awake, sedated, patient cooperative and responds to stimulation  Airway & Oxygen Therapy: Patient Spontanous Breathing and Patient connected to Hasty O2  Post-op Assessment: Report given to PACU RN, Post -op Vital signs reviewed and stable and Patient moving all extremities  Post vital signs: Reviewed and stable  Complications: No apparent anesthesia complications

## 2017-09-29 NOTE — Anesthesia Postprocedure Evaluation (Signed)
Anesthesia Post Note  Patient: Angela Willis  Procedure(s) Performed: LAPAROSCOPIC LEFT SAPINGECTOMY, EXCISION AND ABLATION ENDOMETRIOSIS LYSIS OF ADHESIONS (Left )     Patient location during evaluation: PACU Anesthesia Type: General Level of consciousness: awake and alert Pain management: pain level controlled Vital Signs Assessment: post-procedure vital signs reviewed and stable Respiratory status: spontaneous breathing, nonlabored ventilation, respiratory function stable and patient connected to nasal cannula oxygen Cardiovascular status: blood pressure returned to baseline and stable Postop Assessment: no apparent nausea or vomiting Anesthetic complications: no    Last Vitals:  Vitals:   09/29/17 1400 09/29/17 1430  BP: (!) 113/54 104/63  Pulse: 76 72  Resp: 10 16  Temp: 37 C 36.9 C  SpO2: 94% 93%    Last Pain:  Vitals:   09/29/17 1430  TempSrc:   PainSc: 0-No pain                 Angela Willis

## 2017-09-29 NOTE — Anesthesia Procedure Notes (Signed)
Procedure Name: Intubation Date/Time: 09/29/2017 11:57 AM Performed by: Justice Rocher, CRNA Pre-anesthesia Checklist: Patient identified, Emergency Drugs available, Suction available and Patient being monitored Patient Re-evaluated:Patient Re-evaluated prior to induction Oxygen Delivery Method: Circle system utilized Preoxygenation: Pre-oxygenation with 100% oxygen Induction Type: IV induction Ventilation: Mask ventilation without difficulty Laryngoscope Size: Mac and 4 Grade View: Grade II Tube type: Oral Tube size: 7.5 mm Number of attempts: 1 Airway Equipment and Method: Stylet and Oral airway Placement Confirmation: ETT inserted through vocal cords under direct vision,  positive ETCO2 and breath sounds checked- equal and bilateral Secured at: 23 cm Tube secured with: Tape Dental Injury: Teeth and Oropharynx as per pre-operative assessment  Comments: 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

## 2017-09-29 NOTE — Op Note (Signed)
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 endotracheal  Complications: None  Estimated blood loss: <20 cc  Specimens: Left fallopian tube, anterior cul-de-sac lesion and right uterosacral ligament lesion to pathology  Findings: Exam under anesthesia showed no abnormalities in the pelvis.  On laparoscopy, upper abdomen, liver surface and diaphragm surfaces were normal. Gallbladder was normal. The appendix was grossly normal.   There were adhesions adjacent to the mesentery of the sigmoid in the left iliac fossa.  These were lysed.   The pelvic peritoneum looked mostly normal.  There was a brown pigmented lesion in the anterior cul-de-sac which was excised.  There were brown and fibrotic lesions in the right uterosacral ligament insertion to the uterus; this was excised.  There was a fibrotic lesion of endometriosis at the midline in the posterior cul-de-sac which was ablated.  In addition there was a fibrotic lesion on the left side which was also ablated with needle electrode. The left tube was noted to have formed a hydrosalpinx with a diameter of 1.5 cm upon chromotubation.  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 The right tube was normal proximally and distally with 5 out of 5 fimbria.  The right tube was patent to chromotubation. Both ovaries appeared normal as well as the ovarian fossae.   Description of the procedure: The patient was placed in dorsal supine position and general endotracheal anesthesia was given. 2 g of cefazolin were  given intravenously for prophylaxis. Patient was placed in lithotomy position. She was prepped and draped inside manner.  A Foley catheter was inserted into the bladder. A ZUMI catheter was placed into the uterine cavity.  Uterus sounded to 8 cm.  The surgeon was regloved and a surgical field was created on the abdomen.  After preemptive anesthesia of all surgical sites with 0.25% bupivacaine with 1 200,000 epinephrine, a 3 mm intraumbilical skin incision was made and a Verress needle was inserted. Its correct location was confirmed. A pneumoperitoneum was created with carbon dioxide.  3-mm laparoscope with a 0 lens was inserted and video laparoscopy was started . A left lower quadrant 5 mm and a right lower quadrant  3 mm incisions were made and ancillary trochars were placed under direct visualization. Above findings were noted.   Using a needle electrode on 35 W of cutting current, the left iliac fossa adhesions were lysed.  Next we made a circumscribing incision around the anterior cul-de-sac and posterior cul-de-sac right uterosacral ligament lesions and removed them in a discoid manner.  Good hemostasis was obtained. The fibrotic lesions in the posterior cul-de-sac were ablated with needle electrode in the cutting current mode. Chromic intubation was performed with methylene blue diluted solution and the right tubal patency was confirmed.  The left tube form the hydrosalpinx and it was noted that the infundibular segment of the left tube was absent with disconnected fimbriae attached to the distal end of the tube. Decision was made to perform a left salpingectomy per my preoperative discussion with the patient and her husband.  Harmonic ACE was applied to the left uterotubal junction in the tube was coagulated and cut.  The harmonic ACE was then successively applied to  the mesosalpinx remaining very close to the tubal wall and not spreading the current to the infundibulopelvic ligament and the tube was  coagulated and cut.  It was placed in a 5 mm Endo Catch bag and removed through the left lower quadrant incision. The pelvis was copiously irrigated and aspirated.  Hemostasis was checked once again.  The incisions were approximated with Dermabond.   The patient tolerated the procedure well and was transferred to recovery room in satisfactory condition.  Patient will have ovulation induction with Femara plus metformin plus human menopausal gonadotropins and IUI will be performed when she is ovulating from the right side.   Fermin Schwab, MD

## 2017-09-30 NOTE — Addendum Note (Signed)
Addendum  created 09/30/17 [id number] by Leonides GrillsEllender, Ryan P, MD   Intraprocedure Event edited

## 2017-10-02 ENCOUNTER — Encounter (HOSPITAL_BASED_OUTPATIENT_CLINIC_OR_DEPARTMENT_OTHER): Payer: Self-pay | Admitting: Obstetrics and Gynecology

## 2017-10-06 NOTE — Addendum Note (Signed)
Addendum  created 10/06/17 1105 by Leonides GrillsEllender, Ryan P, MD   Sign clinical note

## 2017-10-06 NOTE — Progress Notes (Signed)
  Anesthesia Note  Patient: Angela Willis, 27 y.o., female  Spoke to patient regarding vocal cord polyp visualized by CRNA during direct laryngoscopy on 09/29/17. Patient previously evaluated by Dr. Narda Bondshris Newman for hoarseness. Encouraged patient to follow up with ENT. Dr. Ezzard StandingNewman notified.    Ryan P Ellender 10/06/2017

## 2018-11-28 LAB — OB RESULTS CONSOLE GC/CHLAMYDIA
Chlamydia: NEGATIVE
Gonorrhea: NEGATIVE

## 2018-11-28 LAB — OB RESULTS CONSOLE GBS: GBS: POSITIVE

## 2018-12-18 LAB — OB RESULTS CONSOLE HIV ANTIBODY (ROUTINE TESTING): HIV: NONREACTIVE

## 2018-12-18 LAB — OB RESULTS CONSOLE RUBELLA ANTIBODY, IGM: Rubella: IMMUNE

## 2018-12-18 LAB — OB RESULTS CONSOLE HEPATITIS B SURFACE ANTIGEN: Hepatitis B Surface Ag: NEGATIVE

## 2018-12-18 LAB — OB RESULTS CONSOLE ABO/RH: RH Type: NEGATIVE

## 2018-12-18 LAB — OB RESULTS CONSOLE RPR: RPR: NONREACTIVE

## 2019-03-21 ENCOUNTER — Inpatient Hospital Stay (HOSPITAL_COMMUNITY)
Admission: AD | Admit: 2019-03-21 | Discharge: 2019-03-22 | Disposition: A | Discharge status: Home / Self Care | Payer: Medicaid Other | Attending: Obstetrics and Gynecology | Admitting: Obstetrics and Gynecology

## 2019-03-21 ENCOUNTER — Encounter (HOSPITAL_COMMUNITY): Payer: Self-pay | Admitting: *Deleted

## 2019-03-21 ENCOUNTER — Other Ambulatory Visit: Payer: Self-pay

## 2019-03-21 DIAGNOSIS — R109 Unspecified abdominal pain: Secondary | ICD-10-CM | POA: Insufficient documentation

## 2019-03-21 DIAGNOSIS — Z3A26 26 weeks gestation of pregnancy: Secondary | ICD-10-CM | POA: Insufficient documentation

## 2019-03-21 DIAGNOSIS — O26893 Other specified pregnancy related conditions, third trimester: Secondary | ICD-10-CM | POA: Diagnosis present

## 2019-03-21 DIAGNOSIS — F909 Attention-deficit hyperactivity disorder, unspecified type: Secondary | ICD-10-CM | POA: Diagnosis not present

## 2019-03-21 DIAGNOSIS — O99342 Other mental disorders complicating pregnancy, second trimester: Secondary | ICD-10-CM | POA: Insufficient documentation

## 2019-03-21 DIAGNOSIS — Y92481 Parking lot as the place of occurrence of the external cause: Secondary | ICD-10-CM | POA: Insufficient documentation

## 2019-03-21 DIAGNOSIS — O99282 Endocrine, nutritional and metabolic diseases complicating pregnancy, second trimester: Secondary | ICD-10-CM | POA: Insufficient documentation

## 2019-03-21 DIAGNOSIS — O9A212 Injury, poisoning and certain other consequences of external causes complicating pregnancy, second trimester: Secondary | ICD-10-CM | POA: Diagnosis not present

## 2019-03-21 DIAGNOSIS — Z7984 Long term (current) use of oral hypoglycemic drugs: Secondary | ICD-10-CM | POA: Diagnosis not present

## 2019-03-21 DIAGNOSIS — Z79899 Other long term (current) drug therapy: Secondary | ICD-10-CM | POA: Insufficient documentation

## 2019-03-21 DIAGNOSIS — Z87891 Personal history of nicotine dependence: Secondary | ICD-10-CM | POA: Insufficient documentation

## 2019-03-21 DIAGNOSIS — W19XXXA Unspecified fall, initial encounter: Secondary | ICD-10-CM | POA: Diagnosis not present

## 2019-03-21 DIAGNOSIS — E282 Polycystic ovarian syndrome: Secondary | ICD-10-CM | POA: Insufficient documentation

## 2019-03-21 LAB — URINALYSIS, ROUTINE W REFLEX MICROSCOPIC
Bilirubin Urine: NEGATIVE
Glucose, UA: NEGATIVE mg/dL
Hgb urine dipstick: NEGATIVE
Ketones, ur: NEGATIVE mg/dL
Leukocytes,Ua: NEGATIVE
Nitrite: NEGATIVE
Protein, ur: 30 mg/dL — AB
Specific Gravity, Urine: 1.028 (ref 1.005–1.030)
pH: 6 (ref 5.0–8.0)

## 2019-03-21 MED ORDER — RHO D IMMUNE GLOBULIN 1500 UNIT/2ML IJ SOSY
300.0000 ug | PREFILLED_SYRINGE | Freq: Once | INTRAMUSCULAR | Status: AC
  Administered 2019-03-21: 300 ug via INTRAMUSCULAR
  Filled 2019-03-21: qty 2

## 2019-03-21 NOTE — MAU Provider Note (Addendum)
Patient Angela Willis is a 28 y.o. G1P0 At [redacted]w[redacted]d here after hitting her abdomen with a fall at 1745. She was loading groceries into her car when the shopping cart rolled away. She fell and hit her abdomen on the right side but mostly hit her elbwos, which she used to catch herself. She denies vaginal bleeding, LOF, decreased fetal movements. She did not hit her head. She drove herself to the hospital.   She denies any complications in her pregnancy thus far.  History     CSN: [id number]  Arrival date and time: 03/21/19 1844   First Provider Initiated Contact with Patient 03/21/19 2013      Chief Complaint  Patient presents with  . Fall   Abdominal Pain This is a new problem. The current episode started today. The onset quality is sudden. The problem occurs constantly. The problem has been unchanged. The pain is at a severity of 3/10. Quality: feels like a bruise on her abdomen. The abdominal pain does not radiate. Nothing aggravates the pain. The pain is relieved by nothing.  She fell on her side and hit the right side of her abdomen, although she caught herself with her elbows.   OB History    Gravida  1   Para      Term      Preterm      AB      Living        SAB      TAB      Ectopic      Multiple      Live Births              Past Medical History:  Diagnosis Date  . ADHD    dx in childhood   . Chronic salpingitis    left   . Headache    occ   . PCOS (polycystic ovarian syndrome)   . PCOS (polycystic ovarian syndrome)     Past Surgical History:  Procedure Laterality Date  . LAPAROSCOPIC BILATERAL SALPINGECTOMY Left 09/29/2017   Procedure: LAPAROSCOPIC LEFT SAPINGECTOMY, EXCISION AND ABLATION ENDOMETRIOSIS LYSIS OF ADHESIONS;  Surgeon: Governor Specking, MD;  Location: [facility];  Service: Gynecology;  Laterality: Left;  . TONSILLECTOMY  age 52     History reviewed. No pertinent family history.  Social History   Tobacco Use   . Smoking status: Former Smoker    Packs/day: 3.00    Years: 4.00    Pack years: 12.00    Types: Cigarettes    Quit date: 09/22/2014    Years since quitting: 4.4  . Smokeless tobacco: Never Used  Substance Use Topics  . Alcohol use: No  . Drug use: No    Allergies:  Allergies  Allergen Reactions  . Hydrocodone Nausea Only    N/V with cough syrup   . Tessalon Perles [Benzonatate]     N/V     Medications Prior to Admission  Medication Sig Dispense Refill Last Dose  . Prenatal MV & Min w/FA-DHA ([facility] PRENATAL GUMMY PO) Take by mouth. Takes 2 gummies once a day   03/21/2019 at Unknown time  . letrozole (FEMARA) 2.5 MG tablet Take 2.5 mg by mouth daily.     . metFORMIN (GLUCOPHAGE) 1000 MG tablet Take 1,000 mg by mouth 2 (two) times daily with a meal.     . ondansetron (ZOFRAN) 4 MG tablet Take 1 tablet (4 mg total) by mouth every 8 (eight) hours as needed for nausea  or vomiting. 12 tablet 0   . oxyCODONE-acetaminophen (PERCOCET) 7.5-325 MG tablet Take 1 tablet by mouth every 4 (four) hours as needed. 12 tablet 0     Review of Systems  Constitutional: Negative.   HENT: Negative.   Eyes: Negative.   Gastrointestinal: Positive for abdominal pain.  Genitourinary: Negative.   Musculoskeletal: Negative.   Neurological: Negative.    Physical Exam   Blood pressure 126/67, pulse (!) 104, temperature 98.1 F (36.7 C), resp. rate 18, height 5\' 3"  (1.6 m), weight 98 kg.  Physical Exam  Constitutional: She appears well-developed.  HENT:  Head: Normocephalic.  Neck: Normal range of motion.  Respiratory: Effort normal.  GI: Soft. There is no abdominal tenderness.  Neurological: She is alert.  Skin: Skin is warm.    MAU Course  Procedures  MDM -NST; 145 bpm, mod var, present acel, neg decels, no contractions.  -Patient says that she is O negative; will do Rhogam workup.   Patient care endorsed to [facility], [facility] at 2045.  Note [facility] be [facility] to [facility]  Results  for orders placed or performed during the hospital encounter of 03/21/19 (from the past 24 hour(s))  Urinalysis, Routine w reflex microscopic     Status: Abnormal   Collection Time: 03/21/19  8:05 PM  Result Value Ref Range   Color, Urine YELLOW YELLOW   APPearance HAZY (A) CLEAR   Specific Gravity, Urine 1.028 1.005 - 1.030   pH 6.0 5.0 - 8.0   Glucose, UA NEGATIVE NEGATIVE mg/dL   Hgb urine dipstick NEGATIVE NEGATIVE   Bilirubin Urine NEGATIVE NEGATIVE   Ketones, ur NEGATIVE NEGATIVE mg/dL   Protein, ur 30 (A) NEGATIVE mg/dL   Nitrite NEGATIVE NEGATIVE   Leukocytes,Ua NEGATIVE NEGATIVE   RBC / HPF 0-5 0 - 5 RBC/hpf   WBC, UA 0-5 0 - 5 WBC/hpf   Bacteria, UA RARE (A) NONE SEEN   Squamous Epithelial / LPF 0-5 0 - 5   Mucus PRESENT   Rh IG workup (includes ABO/Rh)     Status: None (Preliminary result)   Collection Time: 03/21/19  8:43 PM  Result Value Ref Range   Gestational Age(Wks) 26    ABO/RH(D) O NEG    Antibody Screen NEG    Unit Number [id number]    Blood Component Type RHIG    Unit division 00    Status of Unit ISSUED    Transfusion Status      OK TO TRANSFUSE Performed at [facility], [address] [place], [place] [address]   Type and screen [facility]     Status: None   Collection Time: 03/21/19  8:43 PM  Result Value Ref Range   ABO/RH(D) O NEG    Antibody Screen NEG    Sample Expiration      03/24/2019,2359 Performed at [facility], [address] [place], [place] [address]   Kleihauer-Betke stain     Status: None   Collection Time: 03/21/19 10:01 PM  Result Value Ref Range   Fetal Cells % 0 %   Quantitation Fetal Hemoglobin 0 mL   # Vials RhIg 1     Assessment and Plan  28 yo G1P0 at 26.1 EGA who presented to MAU after sustaining a fall in a parking lot -NST reactive, fetal status reassuring -Rh negative, rhogam given -pain from fall resolved -return precautions given -follow up with OB as  scheduled  Braedan Meuth L, DO OB Fellow,  Faculty Practice 03/22/2019 1:26 AM

## 2019-03-21 NOTE — MAU Note (Signed)
Pt fell about 1715. Was running after grocery cart and fell on R elbow. Does think she hit abdomen and R side of abdomen is sore. Has felt FM. No vag bleeding or LOF.

## 2019-03-22 LAB — TYPE AND SCREEN
ABO/RH(D): O NEG
Antibody Screen: NEGATIVE

## 2019-03-22 LAB — RH IG WORKUP (INCLUDES ABO/RH)
ABO/RH(D): O NEG
Antibody Screen: NEGATIVE
Gestational Age(Wks): 26
Unit division: 0

## 2019-03-22 LAB — KLEIHAUER-BETKE STAIN
# Vials RhIg: 1
Fetal Cells %: 0 %
Quantitation Fetal Hemoglobin: 0 mL

## 2019-03-22 NOTE — Progress Notes (Signed)
Written and verbal d/c instructions given and understanding voiced. 

## 2019-03-22 NOTE — Discharge Instructions (Signed)
Abdominal Pain During Pregnancy ° °Belly (abdominal) pain is common during pregnancy. There are many possible causes. Most of the time, it is not a serious problem. Other times, it can be a sign that something is wrong with the pregnancy. Always tell your doctor if you have belly pain. °Follow these instructions at home: °· Do not have sex or put anything in your vagina until your pain goes away completely. °· Get plenty of rest until your pain gets better. °· Drink enough fluid to keep your pee (urine) pale yellow. °· Take over-the-counter and prescription medicines only as told by your doctor. °· Keep all follow-up visits as told by your doctor. This is important. °Contact a doctor if: °· Your pain continues or gets worse after resting. °· You have lower belly pain that: °? Comes and goes at regular times. °? Spreads to your back. °? Feels like menstrual cramps. °· You have pain or burning when you pee (urinate). °Get help right away if: °· You have a fever or chills. °· You have vaginal bleeding. °· You are leaking fluid from your vagina. °· You are passing tissue from your vagina. °· You throw up (vomit) for more than 24 hours. °· You have watery poop (diarrhea) for more than 24 hours. °· Your baby is moving less than usual. °· You feel very weak or faint. °· You have shortness of breath. °· You have very bad pain in your upper belly. °Summary °· Belly (abdominal) pain is common during pregnancy. There are many possible causes. °· If you have belly pain during pregnancy, tell your doctor right away. °· Keep all follow-up visits as told by your doctor. This is important. °This information is not intended to replace advice given to you by your health care provider. Make sure you discuss any questions you have with your health care provider. °Document Released: 06/01/2009 Document Revised: 10/01/2018 Document Reviewed: 09/15/2016 °Elsevier Patient Education © 2020 Elsevier Inc[employer] °Preterm Labor and Birth  Information °Pregnancy normally lasts 39-41 weeks. Preterm labor is when labor starts early. It starts before you have been pregnant for 37 whole weeks. °What are the risk factors for preterm labor? °Preterm labor is more likely to occur in women who: °· Have an infection while pregnant. °· Have a cervix that is short. °· Have gone into preterm labor before. °· Have had surgery on their cervix. °· Are younger than age 17. °· Are older than age 35. °· Are African American. °· Are pregnant with two or more babies. °· Take street drugs while pregnant. °· Smoke while pregnant. °· Do not gain enough weight while pregnant. °· Got pregnant right after another pregnancy. °What are the symptoms of preterm labor? °Symptoms of preterm labor include: °· Cramps. The cramps may feel like the cramps some women get during their period. The cramps may happen with watery poop (diarrhea). °· Pain in the belly (abdomen). °· Pain in the lower back. °· Regular contractions or tightening. It may feel like your belly is getting tighter. °· Pressure in the lower belly that seems to get stronger. °· More fluid (discharge) leaking from the vagina. The fluid may be watery or bloody. °· Water breaking. °Why is it important to notice signs of preterm labor? °Babies who are born early may not be fully developed. They have a higher chance for: °· Long-term heart problems. °· Long-term lung problems. °· Trouble controlling body systems, like breathing. °· Bleeding in the brain. °· A condition called cerebral palsy. °·   Learning difficulties. °· Death. °These risks are highest for babies who are born before 34 weeks of pregnancy. °How is preterm labor treated? °Treatment depends on: °· How long you were pregnant. °· Your condition. °· The health of your baby. °Treatment may involve: °· Having a stitch (suture) placed in your cervix. When you give birth, your cervix opens so the baby can come out. The stitch keeps the cervix from opening too  soon. °· Staying at the hospital. °· Taking or getting medicines, such as: °? Hormone medicines. °? Medicines to stop contractions. °? Medicines to help the baby’s lungs develop. °? Medicines to prevent your baby from having cerebral palsy. °What should I do if I am in preterm labor? °If you think you are going into labor too soon, call your doctor right away. °How can I prevent preterm labor? °· Do not use any tobacco products. °? Examples of these are cigarettes, chewing tobacco, and e-cigarettes. °? If you need help quitting, ask your doctor. °· Do not use street drugs. °· Do not use any medicines unless you ask your doctor if they are safe for you. °· Talk with your doctor before taking any herbal supplements. °· Make sure you gain enough weight. °· Watch for infection. If you think you might have an infection, get it checked right away. °· If you have gone into preterm labor before, tell your doctor. °This information is not intended to replace advice given to you by your health care provider. Make sure you discuss any questions you have with your health care provider. °Document Released: 09/09/2008 Document Revised: 10/05/2018 Document Reviewed: 11/04/2015 °Elsevier Patient Education © 2020 Elsevier Inc. ° °

## 2019-03-22 NOTE — Progress Notes (Signed)
OK to d/c EFM per Dr Darene Lamer

## 2019-04-24 ENCOUNTER — Encounter: Payer: Medicaid Other | Attending: Obstetrics | Admitting: Registered"

## 2019-04-24 ENCOUNTER — Other Ambulatory Visit: Payer: Self-pay

## 2019-04-24 DIAGNOSIS — O99810 Abnormal glucose complicating pregnancy: Secondary | ICD-10-CM | POA: Diagnosis present

## 2019-04-26 ENCOUNTER — Encounter: Payer: Self-pay | Admitting: Registered"

## 2019-04-26 NOTE — Progress Notes (Signed)
Patient was seen on 04/24/19 for Gestational Diabetes self-management class at the [facility]. The following learning objectives were met by the patient during this course:   States the definition of Gestational Diabetes  States why dietary management is important in controlling blood glucose  Describes the effects each nutrient has on blood glucose levels  Demonstrates ability to create a balanced meal plan  Demonstrates carbohydrate counting   States when to check blood glucose levels  Demonstrates proper blood glucose monitoring techniques  States the effect of stress and exercise on blood glucose levels  States the importance of limiting caffeine and abstaining from alcohol and smoking  Blood glucose monitor given: none  Patient instructed to monitor glucose levels: FBS: 60 - <95; 1 hour: <140; 2 hour: <120  Patient received handouts:  Nutrition Diabetes and Pregnancy, including carb counting list  Patient will be seen for follow-up as needed.

## 2019-06-12 ENCOUNTER — Encounter (HOSPITAL_COMMUNITY): Payer: Self-pay | Admitting: *Deleted

## 2019-06-12 ENCOUNTER — Telehealth (HOSPITAL_COMMUNITY): Payer: Self-pay | Admitting: *Deleted

## 2019-06-12 NOTE — Telephone Encounter (Signed)
Preadmission screen  

## 2019-06-17 ENCOUNTER — Inpatient Hospital Stay (HOSPITAL_COMMUNITY)
Admission: AD | Admit: 2019-06-17 | Discharge: 2019-06-17 | Disposition: A | Discharge status: Home / Self Care | Payer: Medicaid Other | Attending: Obstetrics & Gynecology | Admitting: Obstetrics & Gynecology

## 2019-06-17 ENCOUNTER — Encounter (HOSPITAL_COMMUNITY): Payer: Self-pay | Admitting: Obstetrics & Gynecology

## 2019-06-17 ENCOUNTER — Other Ambulatory Visit: Payer: Self-pay

## 2019-06-17 DIAGNOSIS — Z3689 Encounter for other specified antenatal screening: Secondary | ICD-10-CM

## 2019-06-17 DIAGNOSIS — Z87891 Personal history of nicotine dependence: Secondary | ICD-10-CM | POA: Diagnosis not present

## 2019-06-17 DIAGNOSIS — Z833 Family history of diabetes mellitus: Secondary | ICD-10-CM | POA: Insufficient documentation

## 2019-06-17 DIAGNOSIS — Z885 Allergy status to narcotic agent status: Secondary | ICD-10-CM | POA: Insufficient documentation

## 2019-06-17 DIAGNOSIS — O24419 Gestational diabetes mellitus in pregnancy, unspecified control: Secondary | ICD-10-CM | POA: Diagnosis not present

## 2019-06-17 DIAGNOSIS — Z3A38 38 weeks gestation of pregnancy: Secondary | ICD-10-CM | POA: Diagnosis not present

## 2019-06-17 DIAGNOSIS — Z711 Person with feared health complaint in whom no diagnosis is made: Secondary | ICD-10-CM | POA: Diagnosis not present

## 2019-06-17 DIAGNOSIS — Z8349 Family history of other endocrine, nutritional and metabolic diseases: Secondary | ICD-10-CM | POA: Insufficient documentation

## 2019-06-17 NOTE — MAU Note (Signed)
Pt had rtn NST today, sent over for further monitoring.  Pt unsure why, ? Fetal movement, dips. Pt states baby is moving now, 'she just had an orange cup'. Denies pain, bleeding or water leaking. Has been 2 cm for 2 wks.  Is to be induced on Thu.

## 2019-06-17 NOTE — Discharge Instructions (Signed)

## 2019-06-17 NOTE — MAU Provider Note (Signed)
History     CSN: [id number]  Arrival date and time: 06/17/19 1507   First Provider Initiated Contact with Patient 06/17/19 1615      Chief Complaint  Patient presents with  . for further monitoring   [name] is a 28 y.o. G1P0 at [redacted]w[redacted]d who presents to MAU for extended monitoring after her office called MAU to report "subtle variables" on her NST. Pt reports she was having an NST for gestational diabetes with EFW 8lb 10oz last week.  Pt reports she did not feel the baby move as much this morning as is normal for her, and reports more than normal movement while in MAU. Pt reports baby was moving normally last night.  Pt denies VB, LOF, ctx, decreased FM, vaginal discharge/odor/itching. Pt denies N/V, abdominal pain, constipation, diarrhea, or urinary problems. Pt denies fever, chills, fatigue, sweating or changes in appetite. Pt denies SOB or chest pain. Pt denies dizziness, HA, light-headedness, weakness.  Problems this pregnancy include: RH negative, GDM, LGA, GBS POSITIVE Allergies? Hydrocodone, Tessalon Current medications/supplements? PNVs, iron, glyburide Prenatal care provider? [place], [place] 06/20/2019   OB History    Gravida  1   Para      Term      Preterm      AB      Living        SAB      TAB      Ectopic      Multiple      Live Births              Past Medical History:  Diagnosis Date  . ADHD    dx in childhood   . Chronic salpingitis    left   . Gestational diabetes   . Headache    occ   . PCOS (polycystic ovarian syndrome)   . PCOS (polycystic ovarian syndrome)     Past Surgical History:  Procedure Laterality Date  . LAPAROSCOPIC BILATERAL SALPINGECTOMY Left 09/29/2017   Procedure: LAPAROSCOPIC LEFT SAPINGECTOMY, EXCISION AND ABLATION ENDOMETRIOSIS LYSIS OF ADHESIONS;  Surgeon: Governor Specking, MD;  Location: [facility];  Service: Gynecology;  Laterality: Left;  . TONSILLECTOMY  age 42      Family History  Problem Relation Age of Onset  . Thyroid disease Mother   . Diabetes Maternal Grandmother     Social History   Tobacco Use  . Smoking status: Former Smoker    Packs/day: 3.00    Years: 4.00    Pack years: 12.00    Types: Cigarettes    Quit date: 09/22/2014    Years since quitting: 4.7  . Smokeless tobacco: Never Used  Substance Use Topics  . Alcohol use: No  . Drug use: No    Allergies:  Allergies  Allergen Reactions  . Hydrocodone Nausea Only    N/V with cough syrup   . Tessalon Perles [Benzonatate]     N/V     No medications prior to admission.    Review of Systems  Constitutional: Negative for chills, diaphoresis, fatigue and fever.  Eyes: Negative for visual disturbance.  Respiratory: Negative for shortness of breath.   Cardiovascular: Negative for chest pain.  Gastrointestinal: Negative for abdominal pain, constipation, diarrhea, nausea and vomiting.  Genitourinary: Negative for dysuria, flank pain, frequency, pelvic pain, urgency, vaginal bleeding and vaginal discharge.  Neurological: Negative for dizziness, weakness, light-headedness and headaches.   Physical Exam   Blood pressure 121/77, pulse 96, temperature (!) 97.5 F (  36.4 C), temperature source Oral, resp. rate 18, height 5\' 3"  (1.6 m), weight 104.5 kg, SpO2 99 %.  Patient Vitals for the past 24 hrs:  BP Temp Temp src Pulse Resp SpO2 Height Weight  06/17/19 1700 -- -- -- 96 -- 99 % -- --  06/17/19 1658 121/77 (!) 97.5 F (36.4 C) Oral (!) 121 18 99 % -- --  06/17/19 1521 124/65 99.5 F (37.5 C) Oral (!) 101 20 98 % 5\' 3"  (1.6 m) 104.5 kg   Physical Exam  Constitutional: She is oriented to person, place, and time. She appears well-developed and well-nourished. No distress.  HENT:  Head: Normocephalic and atraumatic.  Respiratory: Effort normal.  Neurological: She is alert and oriented to person, place, and time.  Skin: She is not diaphoretic.  Psychiatric: She has a  normal mood and affect. Her behavior is normal. Judgment and thought content normal.   MAU Course  Procedures  MDM -sent from office for extended monitoring -no concerns -EFM: reactive       -baseline: 140/130       -variability: moderate       -accels: present, 15x15       -decels: absent       -TOCO: irregular, pt reports not feeling -called and spoke with Dr. 06/19/19 @430PM , agrees NST is reactive and patient OK to be discharged home with strict return precautions -pt discharged to home in stable condition  Orders Placed This Encounter  Procedures  . Discharge patient    Order Specific Question:   Discharge disposition    Answer:   01-Home or Self Care [1]    Order Specific Question:   Discharge patient date    Answer:   06/17/2019   Assessment and Plan   1. NST (non-stress test) reactive   2. Physically well but worried   3. [redacted] weeks gestation of pregnancy    Allergies as of 06/17/2019      Reactions   Hydrocodone Nausea Only   N/V with cough syrup    Tessalon Perles [benzonatate]    N/V       Medication List    TAKE these medications   [employer] PRENATAL GUMMY PO Take by mouth. Takes 2 gummies once a day   glyBURIDE 2.5 MG tablet Commonly known as: DIABETA Take 2.5 mg by mouth daily with breakfast.   glyBURIDE 5 MG tablet Commonly known as: DIABETA Take 5 mg by mouth every evening.   letrozole 2.5 MG tablet Commonly known as: FEMARA Take 2.5 mg by mouth daily.   metFORMIN 1000 MG tablet Commonly known as: GLUCOPHAGE Take 1,000 mg by mouth 2 (two) times daily with a meal.   ondansetron 4 MG tablet Commonly known as: Zofran Take 1 tablet (4 mg total) by mouth every 8 (eight) hours as needed for nausea or vomiting.   oxyCODONE-acetaminophen 7.5-325 MG tablet Commonly known as: Percocet Take 1 tablet by mouth every 4 (four) hours as needed.      -strict DFM/labor/return MAU precautions given -pt discharged to home in stable condition  06/19/2019 E  Angela Willis 06/17/2019, 5:31 PM

## 2019-06-18 ENCOUNTER — Other Ambulatory Visit (HOSPITAL_COMMUNITY)
Admission: RE | Admit: 2019-06-18 | Discharge: 2019-06-18 | Disposition: A | Discharge status: Home / Self Care | Payer: Medicaid Other | Source: Ambulatory Visit | Attending: Obstetrics | Admitting: Obstetrics

## 2019-06-18 DIAGNOSIS — Z20828 Contact with and (suspected) exposure to other viral communicable diseases: Secondary | ICD-10-CM | POA: Diagnosis not present

## 2019-06-18 DIAGNOSIS — Z01812 Encounter for preprocedural laboratory examination: Secondary | ICD-10-CM | POA: Diagnosis not present

## 2019-06-18 LAB — SARS CORONAVIRUS 2 (TAT 6-24 HRS): SARS Coronavirus 2: NEGATIVE

## 2019-06-20 ENCOUNTER — Inpatient Hospital Stay (HOSPITAL_COMMUNITY): Payer: Medicaid Other | Admitting: Anesthesiology

## 2019-06-20 ENCOUNTER — Inpatient Hospital Stay (HOSPITAL_COMMUNITY): Payer: Medicaid Other

## 2019-06-20 ENCOUNTER — Other Ambulatory Visit: Payer: Self-pay

## 2019-06-20 ENCOUNTER — Encounter (HOSPITAL_COMMUNITY): Payer: Self-pay | Admitting: Obstetrics

## 2019-06-20 ENCOUNTER — Inpatient Hospital Stay (HOSPITAL_COMMUNITY)
Admission: AD | Admit: 2019-06-20 | Discharge: 2019-06-23 | DRG: 788 | Disposition: A | Discharge status: Home / Self Care | Payer: Medicaid Other | Attending: Obstetrics | Admitting: Obstetrics

## 2019-06-20 DIAGNOSIS — Z87891 Personal history of nicotine dependence: Secondary | ICD-10-CM | POA: Diagnosis not present

## 2019-06-20 DIAGNOSIS — O99824 Streptococcus B carrier state complicating childbirth: Secondary | ICD-10-CM | POA: Diagnosis present

## 2019-06-20 DIAGNOSIS — O3663X0 Maternal care for excessive fetal growth, third trimester, not applicable or unspecified: Secondary | ICD-10-CM | POA: Diagnosis present

## 2019-06-20 DIAGNOSIS — Z6791 Unspecified blood type, Rh negative: Secondary | ICD-10-CM

## 2019-06-20 DIAGNOSIS — O24425 Gestational diabetes mellitus in childbirth, controlled by oral hypoglycemic drugs: Principal | ICD-10-CM | POA: Diagnosis present

## 2019-06-20 DIAGNOSIS — O24415 Gestational diabetes mellitus in pregnancy, controlled by oral hypoglycemic drugs: Secondary | ICD-10-CM | POA: Diagnosis present

## 2019-06-20 DIAGNOSIS — O26893 Other specified pregnancy related conditions, third trimester: Secondary | ICD-10-CM | POA: Diagnosis present

## 2019-06-20 DIAGNOSIS — Z3A39 39 weeks gestation of pregnancy: Secondary | ICD-10-CM

## 2019-06-20 LAB — RPR: RPR Ser Ql: NONREACTIVE

## 2019-06-20 LAB — GLUCOSE, CAPILLARY
Glucose-Capillary: 147 mg/dL — ABNORMAL HIGH (ref 70–99)
Glucose-Capillary: 88 mg/dL (ref 70–99)
Glucose-Capillary: 85 mg/dL (ref 70–99)
Glucose-Capillary: 66 mg/dL — ABNORMAL LOW (ref 70–99)
Glucose-Capillary: 74 mg/dL (ref 70–99)
Glucose-Capillary: 80 mg/dL (ref 70–99)

## 2019-06-20 LAB — CBC
HCT: 40.8 % (ref 36.0–46.0)
Hemoglobin: 13.7 g/dL (ref 12.0–15.0)
MCH: 29.2 pg (ref 26.0–34.0)
MCHC: 33.6 g/dL (ref 30.0–36.0)
MCV: 87 fL (ref 80.0–100.0)
Platelets: 285 10*3/uL (ref 150–400)
RBC: 4.69 MIL/uL (ref 3.87–5.11)
RDW: 16.3 % — ABNORMAL HIGH (ref 11.5–15.5)
WBC: 11.8 10*3/uL — ABNORMAL HIGH (ref 4.0–10.5)
nRBC: 0 % (ref 0.0–0.2)

## 2019-06-20 LAB — TYPE AND SCREEN
ABO/RH(D): O NEG
Antibody Screen: NEGATIVE

## 2019-06-20 MED ORDER — TERBUTALINE SULFATE 1 MG/ML IJ SOLN: 0.2500 mg | Freq: Once | INTRAMUSCULAR | Status: DC | PRN

## 2019-06-20 MED ORDER — PHENYLEPHRINE 40 MCG/ML (10ML) SYRINGE FOR IV PUSH (FOR BLOOD PRESSURE SUPPORT)
80.0000 ug | PREFILLED_SYRINGE | INTRAVENOUS | Status: DC | PRN
  Filled 2019-06-20: qty 10

## 2019-06-20 MED ORDER — PHENYLEPHRINE 40 MCG/ML (10ML) SYRINGE FOR IV PUSH (FOR BLOOD PRESSURE SUPPORT): 80.0000 ug | PREFILLED_SYRINGE | INTRAVENOUS | Status: DC | PRN

## 2019-06-20 MED ORDER — EPHEDRINE 5 MG/ML INJ: 10.0000 mg | INTRAVENOUS | Status: DC | PRN

## 2019-06-20 MED ORDER — OXYTOCIN 40 UNITS IN NORMAL SALINE INFUSION - SIMPLE MED
1.0000 m[IU]/min | INTRAVENOUS | Status: DC
  Administered 2019-06-20: 2 m[IU]/min via INTRAVENOUS

## 2019-06-20 MED ORDER — LACTATED RINGERS IV SOLN: 500.0000 mL | INTRAVENOUS | Status: DC | PRN

## 2019-06-20 MED ORDER — FENTANYL-BUPIVACAINE-NACL 0.5-0.125-0.9 MG/250ML-% EP SOLN
12.0000 mL/h | EPIDURAL | Status: DC | PRN
  Filled 2019-06-20: qty 250

## 2019-06-20 MED ORDER — DIPHENHYDRAMINE HCL 50 MG/ML IJ SOLN: 12.5000 mg | INTRAMUSCULAR | Status: DC | PRN

## 2019-06-20 MED ORDER — OXYCODONE-ACETAMINOPHEN 5-325 MG PO TABS: 1.0000 | ORAL_TABLET | ORAL | Status: DC | PRN

## 2019-06-20 MED ORDER — LACTATED RINGERS IV SOLN
500.0000 mL | Freq: Once | INTRAVENOUS | Status: AC
  Administered 2019-06-20: 500 mL via INTRAVENOUS

## 2019-06-20 MED ORDER — SOD CITRATE-CITRIC ACID 500-334 MG/5ML PO SOLN
30.0000 mL | ORAL | Status: DC | PRN
  Administered 2019-06-20 – 2019-06-21 (×2): 30 mL via ORAL
  Filled 2019-06-20 (×2): qty 30

## 2019-06-20 MED ORDER — SODIUM CHLORIDE (PF) 0.9 % IJ SOLN
INTRAMUSCULAR | Status: DC | PRN
  Administered 2019-06-20: 12 mL/h via EPIDURAL

## 2019-06-20 MED ORDER — OXYTOCIN BOLUS FROM INFUSION: 500.0000 mL | Freq: Once | INTRAVENOUS | Status: DC

## 2019-06-20 MED ORDER — ACETAMINOPHEN 325 MG PO TABS: 650.0000 mg | ORAL_TABLET | ORAL | Status: DC | PRN

## 2019-06-20 MED ORDER — OXYCODONE-ACETAMINOPHEN 5-325 MG PO TABS: 2.0000 | ORAL_TABLET | ORAL | Status: DC | PRN

## 2019-06-20 MED ORDER — SODIUM CHLORIDE 0.9 % IV SOLN
5.0000 10*6.[IU] | Freq: Once | INTRAVENOUS | Status: AC
  Administered 2019-06-20: 5 10*6.[IU] via INTRAVENOUS
  Filled 2019-06-20: qty 5

## 2019-06-20 MED ORDER — LIDOCAINE HCL (PF) 1 % IJ SOLN
30.0000 mL | INTRAMUSCULAR | Status: AC | PRN
  Administered 2019-06-20: 5 mL via SUBCUTANEOUS

## 2019-06-20 MED ORDER — PENICILLIN G POT IN DEXTROSE 60000 UNIT/ML IV SOLN
3.0000 10*6.[IU] | INTRAVENOUS | Status: DC
  Administered 2019-06-20 – 2019-06-21 (×4): 3 10*6.[IU] via INTRAVENOUS
  Filled 2019-06-20 (×4): qty 50

## 2019-06-20 MED ORDER — ONDANSETRON HCL 4 MG/2ML IJ SOLN
4.0000 mg | Freq: Four times a day (QID) | INTRAMUSCULAR | Status: DC | PRN
  Administered 2019-06-21 (×2): 4 mg via INTRAVENOUS
  Filled 2019-06-20: qty 2

## 2019-06-20 MED ORDER — LACTATED RINGERS IV SOLN: INTRAVENOUS | Status: DC

## 2019-06-20 MED ORDER — FENTANYL CITRATE (PF) 100 MCG/2ML IJ SOLN
50.0000 ug | INTRAMUSCULAR | Status: DC | PRN
  Administered 2019-06-20: 100 ug via INTRAVENOUS
  Filled 2019-06-20: qty 2

## 2019-06-20 MED ORDER — OXYTOCIN 40 UNITS IN NORMAL SALINE INFUSION - SIMPLE MED
2.5000 [IU]/h | INTRAVENOUS | Status: DC
  Administered 2019-06-21: 06:00:00 10 [IU]/h via INTRAVENOUS
  Filled 2019-06-20: qty 1000

## 2019-06-20 NOTE — Anesthesia Procedure Notes (Signed)
Epidural Patient location during procedure: OB Start time: 06/20/2019 7:09 PM End time: 06/20/2019 7:15 PM  Staffing Anesthesiologist: Effie Berkshire, MD Performed: anesthesiologist   Preanesthetic Checklist Completed: patient identified, IV checked, site marked, risks and benefits discussed, surgical consent, monitors and equipment checked, pre-op evaluation and timeout performed  Epidural Patient position: sitting Prep: ChloraPrep Patient monitoring: heart rate, continuous pulse ox and blood pressure Approach: midline Location: L3-L4 Injection technique: LOR saline  Needle:  Needle type: Tuohy  Needle gauge: 17 G Needle length: 9 cm Catheter type: closed end flexible Catheter size: 20 Guage Test dose: negative and 1.5% lidocaine  Assessment Events: blood not aspirated, injection not painful, no injection resistance and no paresthesia  Additional Notes LOR @ 5.5  Patient identified. Risks/Benefits/Options discussed with patient including but not limited to bleeding, infection, nerve damage, paralysis, failed block, incomplete pain control, headache, blood pressure changes, nausea, vomiting, reactions to medications, itching and postpartum back pain. Confirmed with bedside nurse the patient's most recent platelet count. Confirmed with patient that they are not currently taking any anticoagulation, have any bleeding history or any family history of bleeding disorders. Patient expressed understanding and wished to proceed. All questions were answered. Sterile technique was used throughout the entire procedure. Please see nursing notes for vital signs. Test dose was given through epidural catheter and negative prior to continuing to dose epidural or start infusion. Warning signs of high block given to the patient including shortness of breath, tingling/numbness in hands, complete motor block, or any concerning symptoms with instructions to call for help. Patient was given instructions  on fall risk and not to get out of bed. All questions and concerns addressed with instructions to call with any issues or inadequate analgesia.    Reason for block:procedure for pain

## 2019-06-20 NOTE — H&P (Signed)
28 y.o. G1P0 @ [redacted]w[redacted]d presents for IOL for GDMA2.  Otherwise has good fetal movement and no bleeding.  Pregnancy c/b: 1. GDMA2:  On glyburide 2.5 qAM and 5mg  qhs.  Most recent EFW on 12/17 was 3900 gm, >90% 2. GBS bacteruria 3. Abdominal trauma (fall) at 26 weeks.  Was monitored in MAU, received rhogam. Past Medical History:  Diagnosis Date  . ADHD    dx in childhood   . Chronic salpingitis    left   . Gestational diabetes   . Headache    occ   . PCOS (polycystic ovarian syndrome)   . PCOS (polycystic ovarian syndrome)     Past Surgical History:  Procedure Laterality Date  . LAPAROSCOPIC BILATERAL SALPINGECTOMY Left 09/29/2017   Procedure: LAPAROSCOPIC LEFT SAPINGECTOMY, EXCISION AND ABLATION ENDOMETRIOSIS LYSIS OF ADHESIONS;  Surgeon: Governor Specking, MD;  Location: [facility];  Service: Gynecology;  Laterality: Left;  . TONSILLECTOMY  age 28     OB History  Gravida Para Term Preterm AB Living  1            SAB TAB Ectopic Multiple Live Births               # Outcome Date GA Lbr Len/2nd Weight Sex Delivery Anes PTL Lv  1 Current             Social History   Socioeconomic History  . Marital status: Single    Spouse name: Not on file  . Number of children: Not on file  . Years of education: Not on file  . Highest education level: Not on file  Occupational History  . Not on file  Tobacco Use  . Smoking status: Former Smoker    Packs/day: 3.00    Years: 4.00    Pack years: 12.00    Types: Cigarettes    Quit date: 09/22/2014    Years since quitting: 4.7  . Smokeless tobacco: Never Used  Substance and Sexual Activity  . Alcohol use: No  . Drug use: No  . Sexual activity: Yes  Other Topics Concern  . Not on file  Social History Narrative  . Not on file     Hydrocodone and Tessalon perles [benzonatate]    Prenatal Transfer Tool  Maternal Diabetes: Yes:  Diabetes Type:  Insulin/Medication controlled Genetic Screening: Declined Maternal  Ultrasounds/Referrals: Normal Fetal Ultrasounds or other Referrals:  None Maternal Substance Abuse:  No Significant Maternal Medications:  Meds include: Other:  glyburide Significant Maternal Lab Results: Group B Strep positive and Rh negative  ABO, Rh: --/--/O NEG (12/24 0825) Antibody: NEG (12/24 0825) Rubella: Immune (06/23 0000) RPR: Nonreactive (06/23 0000)  HBsAg: Negative (06/23 0000)  HIV: Non-reactive (06/23 0000)  GBS: Positive/-- (06/03 0000)      Vitals:   06/20/19 0930 06/20/19 1014  BP: 125/83 130/84  Pulse: (!) 107 100  Resp: 18 18  Temp:       General:  NAD Abdomen:  soft, gravid, EFW 9# Ex:  trace edema SVE:  3/70/-3 FHTs:  130s, moderate variability, + accels, no decels Toco:  quiet   A/P   28 y.o. G1P0 [redacted]w[redacted]d presents for induction of labor for GDMA2 Admit to L&D Cervix favorable--will start pitocin Suspected fetal macrosomia--EFW at 4100 gm.  Is below 4500 gm cutoff for macrocosmic infants in diabetic mothers to consider primary cesarean delivery.  Will monitor labor curve closely GDMA2--monitor CBG in labor FSR/ vtx/ GBS positive--pcn  [place]

## 2019-06-20 NOTE — Progress Notes (Signed)
Mild cramping  BP 120/74   Pulse 91   Temp 98.1 F (36.7 C) (Oral)   Resp 16   Ht 5\' 3"  (1.6 m)   Wt 106.4 kg   BMI 41.55 kg/m  Toco: q2-3 minutes EFM: 130s, moderate variability, + accels, no decels, category 1 SVE:  3/80/-2, AROM clear fluid   A/P:  G1 @ 39.0 w IOL for GDMA1 Continue pitocin--still in latent labor GDMA1--q4h FSBS  FSR / vertex gbs positive on pcn

## 2019-06-20 NOTE — Anesthesia Preprocedure Evaluation (Signed)
Anesthesia Evaluation  Patient identified by MRN, date of birth, ID band Patient awake    Reviewed: Allergy & Precautions, Patient's Chart, lab work & pertinent test results  Airway Mallampati: II       Dental no notable dental hx.    Pulmonary former smoker,    Pulmonary exam normal        Cardiovascular negative cardio ROS   Rhythm:Regular Rate:Normal     Neuro/Psych  Headaches,    GI/Hepatic negative GI ROS,   Endo/Other  diabetes, Type 2, Oral Hypoglycemic Agents  Renal/GU      Musculoskeletal   Abdominal (+) + obese,   Peds  Hematology   Anesthesia Other Findings   Reproductive/Obstetrics (+) Pregnancy                             Anesthesia Physical Anesthesia Plan  ASA: III  Anesthesia Plan: Epidural   Post-op Pain Management:    Induction:   PONV Risk Score and Plan:   Airway Management Planned: Natural Airway  Additional Equipment: None  Intra-op Plan:   Post-operative Plan:   Informed Consent: I have reviewed the patients History and Physical, chart, labs and discussed the procedure including the risks, benefits and alternatives for the proposed anesthesia with the patient or authorized representative who has indicated his/her understanding and acceptance.       Plan Discussed with:   Anesthesia Plan Comments: (Lab Results      Component                Value               Date                      WBC                      11.8 (H)            06/20/2019                HGB                      13.7                06/20/2019                HCT                      40.8                06/20/2019                MCV                      87.0                06/20/2019                PLT                      285                 06/20/2019           )        Anesthesia Quick Evaluation

## 2019-06-21 ENCOUNTER — Encounter (HOSPITAL_COMMUNITY): Payer: Self-pay | Admitting: Obstetrics

## 2019-06-21 ENCOUNTER — Encounter (HOSPITAL_COMMUNITY)
Admission: AD | Disposition: A | Discharge status: Home / Self Care | Payer: Self-pay | Source: Home / Self Care | Attending: Obstetrics

## 2019-06-21 LAB — GLUCOSE, CAPILLARY: Glucose-Capillary: 124 mg/dL — ABNORMAL HIGH (ref 70–99)

## 2019-06-21 SURGERY — Surgical Case
Anesthesia: Epidural | Wound class: Clean Contaminated

## 2019-06-21 MED ORDER — NALOXONE HCL 4 MG/10ML IJ SOLN
1.0000 ug/kg/h | INTRAVENOUS | Status: DC | PRN
  Filled 2019-06-21: qty 5

## 2019-06-21 MED ORDER — DEXAMETHASONE SODIUM PHOSPHATE 10 MG/ML IJ SOLN
INTRAMUSCULAR | Status: DC | PRN
  Administered 2019-06-21: 10 mg via INTRAVENOUS

## 2019-06-21 MED ORDER — SODIUM CHLORIDE 0.9 % IV SOLN: 500.0000 mg | INTRAVENOUS | Status: DC

## 2019-06-21 MED ORDER — MEPERIDINE HCL 25 MG/ML IJ SOLN: 6.2500 mg | INTRAMUSCULAR | Status: DC | PRN

## 2019-06-21 MED ORDER — MORPHINE SULFATE (PF) 0.5 MG/ML IJ SOLN
INTRAMUSCULAR | Status: DC | PRN
  Administered 2019-06-21: 3 mg via EPIDURAL

## 2019-06-21 MED ORDER — ACETAMINOPHEN 10 MG/ML IV SOLN
INTRAVENOUS | Status: AC
  Filled 2019-06-21: qty 100

## 2019-06-21 MED ORDER — ACETAMINOPHEN 500 MG PO TABS: 1000.0000 mg | ORAL_TABLET | Freq: Four times a day (QID) | ORAL | Status: DC

## 2019-06-21 MED ORDER — PHENYLEPHRINE 40 MCG/ML (10ML) SYRINGE FOR IV PUSH (FOR BLOOD PRESSURE SUPPORT)
PREFILLED_SYRINGE | INTRAVENOUS | Status: AC
  Filled 2019-06-21: qty 10

## 2019-06-21 MED ORDER — KETOROLAC TROMETHAMINE 30 MG/ML IJ SOLN
INTRAMUSCULAR | Status: AC
  Filled 2019-06-21: qty 1

## 2019-06-21 MED ORDER — METOCLOPRAMIDE HCL 5 MG/ML IJ SOLN
INTRAMUSCULAR | Status: DC | PRN
  Administered 2019-06-21: 10 mg via INTRAVENOUS

## 2019-06-21 MED ORDER — SCOPOLAMINE 1 MG/3DAYS TD PT72
MEDICATED_PATCH | TRANSDERMAL | Status: AC
  Filled 2019-06-21: qty 1

## 2019-06-21 MED ORDER — SODIUM CHLORIDE 0.9 % IV SOLN
INTRAVENOUS | Status: AC
  Filled 2019-06-21: qty 500

## 2019-06-21 MED ORDER — NALOXONE HCL 0.4 MG/ML IJ SOLN: 0.4000 mg | INTRAMUSCULAR | Status: DC | PRN

## 2019-06-21 MED ORDER — ACETAMINOPHEN 500 MG PO TABS
1000.0000 mg | ORAL_TABLET | Freq: Four times a day (QID) | ORAL | Status: DC
  Administered 2019-06-21 – 2019-06-23 (×9): 1000 mg via ORAL
  Filled 2019-06-21 (×9): qty 2

## 2019-06-21 MED ORDER — FENTANYL-BUPIVACAINE-NACL 0.5-0.125-0.9 MG/250ML-% EP SOLN: 12.0000 mL/h | EPIDURAL | Status: DC | PRN

## 2019-06-21 MED ORDER — LACTATED RINGERS IV SOLN: INTRAVENOUS | Status: DC

## 2019-06-21 MED ORDER — ACETAMINOPHEN 10 MG/ML IV SOLN
1000.0000 mg | Freq: Once | INTRAVENOUS | Status: DC | PRN
  Administered 2019-06-21: 07:00:00 1000 mg via INTRAVENOUS

## 2019-06-21 MED ORDER — CEFAZOLIN SODIUM-DEXTROSE 2-4 GM/100ML-% IV SOLN: 2.0000 g | Freq: Once | INTRAVENOUS | Status: DC

## 2019-06-21 MED ORDER — OXYTOCIN 10 UNIT/ML IJ SOLN
INTRAMUSCULAR | Status: AC
  Filled 2019-06-21: qty 1

## 2019-06-21 MED ORDER — CEFAZOLIN SODIUM-DEXTROSE 2-4 GM/100ML-% IV SOLN
INTRAVENOUS | Status: AC
  Filled 2019-06-21: qty 100

## 2019-06-21 MED ORDER — DIPHENHYDRAMINE HCL 25 MG PO CAPS: 25.0000 mg | ORAL_CAPSULE | Freq: Four times a day (QID) | ORAL | Status: DC | PRN

## 2019-06-21 MED ORDER — COCONUT OIL OIL: 1.0000 "application " | TOPICAL_OIL | Status: DC | PRN

## 2019-06-21 MED ORDER — SODIUM CHLORIDE 0.9% FLUSH: 3.0000 mL | INTRAVENOUS | Status: DC | PRN

## 2019-06-21 MED ORDER — PRENATAL MULTIVITAMIN CH
1.0000 | ORAL_TABLET | Freq: Every day | ORAL | Status: DC
  Administered 2019-06-21 – 2019-06-23 (×3): 1 via ORAL
  Filled 2019-06-21 (×3): qty 1

## 2019-06-21 MED ORDER — MORPHINE SULFATE (PF) 0.5 MG/ML IJ SOLN
INTRAMUSCULAR | Status: AC
  Filled 2019-06-21: qty 10

## 2019-06-21 MED ORDER — KETOROLAC TROMETHAMINE 30 MG/ML IJ SOLN
30.0000 mg | Freq: Four times a day (QID) | INTRAMUSCULAR | Status: DC | PRN
  Administered 2019-06-21 (×2): 30 mg via INTRAMUSCULAR

## 2019-06-21 MED ORDER — NALBUPHINE HCL 10 MG/ML IJ SOLN: 5.0000 mg | INTRAMUSCULAR | Status: DC | PRN

## 2019-06-21 MED ORDER — ACETAMINOPHEN 160 MG/5ML PO SOLN: 325.0000 mg | Freq: Once | ORAL | Status: DC | PRN

## 2019-06-21 MED ORDER — NALBUPHINE HCL 10 MG/ML IJ SOLN: 5.0000 mg | Freq: Once | INTRAMUSCULAR | Status: DC | PRN

## 2019-06-21 MED ORDER — DIPHENHYDRAMINE HCL 25 MG PO CAPS: 25.0000 mg | ORAL_CAPSULE | ORAL | Status: DC | PRN

## 2019-06-21 MED ORDER — DEXAMETHASONE SODIUM PHOSPHATE 10 MG/ML IJ SOLN
INTRAMUSCULAR | Status: AC
  Filled 2019-06-21: qty 1

## 2019-06-21 MED ORDER — PHENYLEPHRINE 40 MCG/ML (10ML) SYRINGE FOR IV PUSH (FOR BLOOD PRESSURE SUPPORT): 80.0000 ug | PREFILLED_SYRINGE | INTRAVENOUS | Status: DC | PRN

## 2019-06-21 MED ORDER — SIMETHICONE 80 MG PO CHEW
80.0000 mg | CHEWABLE_TABLET | ORAL | Status: DC
  Administered 2019-06-21 – 2019-06-23 (×2): 80 mg via ORAL
  Filled 2019-06-21 (×4): qty 1

## 2019-06-21 MED ORDER — IBUPROFEN 800 MG PO TABS
800.0000 mg | ORAL_TABLET | Freq: Four times a day (QID) | ORAL | Status: DC
  Administered 2019-06-22 – 2019-06-23 (×6): 800 mg via ORAL
  Filled 2019-06-21 (×6): qty 1

## 2019-06-21 MED ORDER — DIBUCAINE (PERIANAL) 1 % EX OINT: 1.0000 "application " | TOPICAL_OINTMENT | CUTANEOUS | Status: DC | PRN

## 2019-06-21 MED ORDER — DOCUSATE SODIUM 100 MG PO CAPS
100.0000 mg | ORAL_CAPSULE | Freq: Every day | ORAL | Status: DC
  Administered 2019-06-22 – 2019-06-23 (×2): 100 mg via ORAL
  Filled 2019-06-21 (×3): qty 1

## 2019-06-21 MED ORDER — SCOPOLAMINE 1 MG/3DAYS TD PT72
1.0000 | MEDICATED_PATCH | Freq: Once | TRANSDERMAL | Status: DC
  Administered 2019-06-21: 07:00:00 1.5 mg via TRANSDERMAL

## 2019-06-21 MED ORDER — ONDANSETRON HCL 4 MG/2ML IJ SOLN
INTRAMUSCULAR | Status: AC
  Filled 2019-06-21: qty 2

## 2019-06-21 MED ORDER — SIMETHICONE 80 MG PO CHEW
80.0000 mg | CHEWABLE_TABLET | Freq: Three times a day (TID) | ORAL | Status: DC
  Administered 2019-06-21 – 2019-06-23 (×6): 80 mg via ORAL
  Filled 2019-06-21 (×8): qty 1

## 2019-06-21 MED ORDER — MEPERIDINE HCL 25 MG/ML IJ SOLN
INTRAMUSCULAR | Status: AC
  Filled 2019-06-21: qty 1

## 2019-06-21 MED ORDER — SIMETHICONE 80 MG PO CHEW: 80.0000 mg | CHEWABLE_TABLET | ORAL | Status: DC | PRN

## 2019-06-21 MED ORDER — ONDANSETRON HCL 4 MG/2ML IJ SOLN: 4.0000 mg | Freq: Three times a day (TID) | INTRAMUSCULAR | Status: DC | PRN

## 2019-06-21 MED ORDER — MENTHOL 3 MG MT LOZG: 1.0000 | LOZENGE | OROMUCOSAL | Status: DC | PRN

## 2019-06-21 MED ORDER — KETOROLAC TROMETHAMINE 30 MG/ML IJ SOLN
30.0000 mg | Freq: Four times a day (QID) | INTRAMUSCULAR | Status: AC
  Administered 2019-06-21: 30 mg via INTRAVENOUS
  Filled 2019-06-21 (×2): qty 1

## 2019-06-21 MED ORDER — OXYTOCIN 40 UNITS IN NORMAL SALINE INFUSION - SIMPLE MED: 2.5000 [IU]/h | INTRAVENOUS | Status: DC

## 2019-06-21 MED ORDER — FENTANYL CITRATE (PF) 100 MCG/2ML IJ SOLN
25.0000 ug | INTRAMUSCULAR | Status: DC | PRN
  Administered 2019-06-21: 50 ug via INTRAVENOUS

## 2019-06-21 MED ORDER — PROMETHAZINE HCL 25 MG/ML IJ SOLN: 6.2500 mg | INTRAMUSCULAR | Status: DC | PRN

## 2019-06-21 MED ORDER — DIPHENHYDRAMINE HCL 50 MG/ML IJ SOLN: 12.5000 mg | INTRAMUSCULAR | Status: DC | PRN

## 2019-06-21 MED ORDER — ACETAMINOPHEN 325 MG PO TABS: 325.0000 mg | ORAL_TABLET | Freq: Once | ORAL | Status: DC | PRN

## 2019-06-21 MED ORDER — SODIUM CHLORIDE 0.9 % IR SOLN
Status: DC | PRN
  Administered 2019-06-21: 1

## 2019-06-21 MED ORDER — LACTATED RINGERS IV SOLN: 500.0000 mL | Freq: Once | INTRAVENOUS | Status: DC

## 2019-06-21 MED ORDER — OXYCODONE HCL 5 MG PO TABS
5.0000 mg | ORAL_TABLET | ORAL | Status: DC | PRN
  Administered 2019-06-22 – 2019-06-23 (×5): 5 mg via ORAL
  Filled 2019-06-21 (×5): qty 1

## 2019-06-21 MED ORDER — WITCH HAZEL-GLYCERIN EX PADS: 1.0000 "application " | MEDICATED_PAD | CUTANEOUS | Status: DC | PRN

## 2019-06-21 MED ORDER — KETOROLAC TROMETHAMINE 30 MG/ML IJ SOLN: 30.0000 mg | Freq: Four times a day (QID) | INTRAMUSCULAR | Status: DC | PRN

## 2019-06-21 MED ORDER — EPHEDRINE 5 MG/ML INJ
10.0000 mg | INTRAVENOUS | Status: DC | PRN
  Filled 2019-06-21: qty 2

## 2019-06-21 MED ORDER — SODIUM BICARBONATE 8.4 % IV SOLN
INTRAVENOUS | Status: DC | PRN
  Administered 2019-06-21 (×2): 5 mL via EPIDURAL

## 2019-06-21 MED ORDER — METOCLOPRAMIDE HCL 5 MG/ML IJ SOLN
INTRAMUSCULAR | Status: AC
  Filled 2019-06-21: qty 2

## 2019-06-21 MED ORDER — FENTANYL CITRATE (PF) 100 MCG/2ML IJ SOLN
INTRAMUSCULAR | Status: AC
  Filled 2019-06-21: qty 2

## 2019-06-21 SURGICAL SUPPLY — 39 items
BENZOIN TINCTURE PRP APPL 2/3 (GAUZE/BANDAGES/DRESSINGS) ×3 IMPLANT
CHLORAPREP W/TINT 26ML (MISCELLANEOUS) ×3 IMPLANT
CLAMP CORD UMBIL (MISCELLANEOUS) IMPLANT
CLOSURE WOUND 1/2 X4 (GAUZE/BANDAGES/DRESSINGS) ×1
CLOTH BEACON ORANGE TIMEOUT ST (SAFETY) ×3 IMPLANT
DRSG OPSITE POSTOP 4X10 (GAUZE/BANDAGES/DRESSINGS) ×3 IMPLANT
ELECT REM PT RETURN 9FT ADLT (ELECTROSURGICAL) ×3
ELECTRODE REM PT RTRN 9FT ADLT (ELECTROSURGICAL) ×1 IMPLANT
EXTRACTOR VACUUM KIWI (MISCELLANEOUS) IMPLANT
GLOVE BIOGEL PI IND STRL 6.5 (GLOVE) ×1 IMPLANT
GLOVE BIOGEL PI IND STRL 7.0 (GLOVE) ×1 IMPLANT
GLOVE BIOGEL PI INDICATOR 6.5 (GLOVE) ×2
GLOVE BIOGEL PI INDICATOR 7.0 (GLOVE) ×2
GLOVE ECLIPSE 6.0 STRL STRAW (GLOVE) ×3 IMPLANT
GOWN STRL REUS W/TWL LRG LVL3 (GOWN DISPOSABLE) ×6 IMPLANT
HOVERMATT SINGLE USE (MISCELLANEOUS) ×3 IMPLANT
KIT ABG SYR 3ML LUER SLIP (SYRINGE) IMPLANT
NEEDLE HYPO 25X5/8 SAFETYGLIDE (NEEDLE) IMPLANT
NS IRRIG 1000ML POUR BTL (IV SOLUTION) ×3 IMPLANT
PACK C SECTION WH (CUSTOM PROCEDURE TRAY) ×3 IMPLANT
PAD OB MATERNITY 4.3X12.25 (PERSONAL CARE ITEMS) ×3 IMPLANT
PENCIL SMOKE EVAC W/HOLSTER (ELECTROSURGICAL) ×3 IMPLANT
RETRACTOR TRAXI PANNICULUS (MISCELLANEOUS) ×1 IMPLANT
RTRCTR C-SECT PINK 25CM LRG (MISCELLANEOUS) ×3 IMPLANT
STRIP CLOSURE SKIN 1/2X4 (GAUZE/BANDAGES/DRESSINGS) ×2 IMPLANT
SUT MNCRL 0 VIOLET CTX 36 (SUTURE) ×2 IMPLANT
SUT MNCRL AB 3-0 PS2 27 (SUTURE) ×3 IMPLANT
SUT MONOCRYL 0 CTX 36 (SUTURE) ×4
SUT PLAIN 0 NONE (SUTURE) IMPLANT
SUT PLAIN 2 0 (SUTURE) ×2
SUT PLAIN ABS 2-0 CT1 27XMFL (SUTURE) ×1 IMPLANT
SUT VIC AB 0 CTX 36 (SUTURE) ×4
SUT VIC AB 0 CTX36XBRD ANBCTRL (SUTURE) ×2 IMPLANT
SUT VIC AB 2-0 CT1 27 (SUTURE) ×2
SUT VIC AB 2-0 CT1 TAPERPNT 27 (SUTURE) ×1 IMPLANT
TOWEL OR 17X24 6PK STRL BLUE (TOWEL DISPOSABLE) ×3 IMPLANT
TRAXI PANNICULUS RETRACTOR (MISCELLANEOUS) ×2
TRAY FOLEY W/BAG SLVR 14FR LF (SET/KITS/TRAYS/PACK) ×3 IMPLANT
WATER STERILE IRR 1000ML POUR (IV SOLUTION) ×3 IMPLANT

## 2019-06-21 NOTE — Transfer of Care (Signed)
Immediate Anesthesia Transfer of Care Note  Patient: Angela Willis  Procedure(s) Performed: CESAREAN SECTION (N/A )  Patient Location: PACU and PICU  Anesthesia Type:Epidural  Level of Consciousness: awake, alert , oriented and patient cooperative  Airway & Oxygen Therapy: Patient Spontanous Breathing  Post-op Assessment: Report given to RN and Post -op Vital signs reviewed and stable  Post vital signs: Reviewed and stable  Last Vitals:  Vitals Value Taken Time  BP 98/84 06/21/19 0640  Temp    Pulse 97 06/21/19 0641  Resp 21 06/21/19 0641  SpO2 100 % 06/21/19 0641  Vitals shown include unvalidated device data.  Last Pain:  Vitals:   06/21/19 0200  TempSrc: Oral  PainSc:          Complications: No apparent anesthesia complications

## 2019-06-21 NOTE — Brief Op Note (Signed)
06/20/2019 - 06/21/2019  6:20 AM  PATIENT:  Angela Willis  28 y.o. female  PRE-OPERATIVE DIAGNOSIS:  Arrest of Descent  POST-OPERATIVE DIAGNOSIS:  Arrest of Descent  PROCEDURE:  Procedure(s): CESAREAN SECTION (N/A)  SURGEON:  Surgeon(s) and Role:    Jerelyn Charles, MD - Primary  ANESTHESIA:   epidural  EBL:  41mL   BLOOD ADMINISTERED:none  DRAINS: none   LOCAL MEDICATIONS USED:  NONE  SPECIMEN:  No Specimen   COUNTS:  YES  TOURNIQUET:  * No tourniquets in log *  DICTATION: .Note written in EPIC  PLAN OF CARE: Admit to inpatient   PATIENT DISPOSITION:  PACU - hemodynamically stable.   Delay start of Pharmacological VTE agent (>24hrs) due to surgical blood loss or risk of bleeding: not applicable

## 2019-06-21 NOTE — Lactation Note (Signed)
This note was copied from a baby's chart. Lactation Consultation Note  Patient Name: Angela Willis Today's Date: 06/21/2019 Reason for consult: Initial assessment;Primapara  Initial visit at 6 hours of life. Mom is a P1. She experienced significant breast changes w/pregnancy--she went from a DD to a G/H-sized cup.   Mom has PCOS & GDM, but we were able to express almost 12 mL of colostrum with ease through hand expression. "Drema Pry" was put to the breast, but she was not interested in eating. I finger-fed her about 0.5 mL of Mom's EBM. Infant was left to do skin-to-skin.   Mom is active with [facility] & attended their breastfeeding classes. I made Mom aware of O/P services, breastfeeding support groups, and our phone # for post-discharge questions.   I explained to Mom that the 1st day of life tends to be a "sleepy day" for newborns. I encouraged Mom to try every few hours to see if infant will latch & if infant won't latch, ask nursing staff for assist with spoon-feeding/syringe-feeding a small amount of the colostrum that was expressed during this consult.   Matthias Hughs [facility] 06/21/2019, 12:34 PM

## 2019-06-21 NOTE — Op Note (Signed)
Cesarean Section Procedure Note  Pre-operative Diagnosis: 1. Intrauterine pregnancy at [redacted]w[redacted]d  2. Class A2 gestational diabetes  3.  Arrest of descent  Post-operative Diagnosis: same as above  Surgeon: Jerelyn Charles, MD  Procedure: Primary low transverse cesarean section   Anesthesia: Epidural anesthesia  Estimated Blood Loss: 532 mL         Drains: Foley catheter         Specimens: placenta to L&D              Complications:  None; patient tolerated the procedure well.         Disposition: PACU - hemodynamically stable.  Findings:  Normal uterus, normal right tube.  Left tube surgically absent.  Bowel obscured visualization of ovaries bilaterally.  Viable female infant, weight pending Apgars 8, 8.    Procedure Details   After epidural anesthesia was found to adequate, the patient was placed in the dorsal supine position with a leftward tilt, prepped and draped in the usual sterile manner. A Pfannenstiel incision was made and carried down through the subcutaneous tissue to the fascia.  The fascia was incised in the midline and the fascial incision was extended laterally with Mayo scissors. The superior aspect of the fascial incision was grasped with two Kocher clamps, tented up and the rectus muscles dissected off sharply. The rectus was then dissected off with blunt dissection and Mayo scissors inferiorly. The rectus muscles were separated in the midline. The abdominal peritoneum was identified, tented up, entered bluntly, and the incision was extended superiorly and inferiorly with good visualization of the bladder. The Alexis retractor was deployed. The vesicouterine peritoneum was identified, tented up, entered sharply, and the bladder flap was created digitally. A scalpel was then used to make a low transverse incision on the uterus which was extended in the cephalad-caudad direction with blunt dissection. The fluid was clear. The fetal vertex was identified, elevated out of the pelvis  and brought to the hysterotomy.  The head was delivered easily followed by the shoulders and body.  After a 30 second delay per protocol, the cord was clamped and cut and the infant was passed to the waiting neonatologist.  The placenta was then delivered spontaneously, intact and appear normal, the uterus was cleared of all clot and debris   The hysterotomy was repaired with #0 Monocryl in running locked fashion.  A second imbricating layer of #0 Monocryl was placed.  A figure of eight suture was placed at the left aspect of the hysterotomy, and excellent hemostasis was noted.  The Alexis retractor was removed from the abdomen. The peritoneum was examined and all vessels noted to be hemostatic. The abdominal cavity was cleared of all clot and debris.  The peritoneum was closed with 2-0 vicryl in a running fashion.  The rectus muscles were then closed with 2-0 Vicryl. The fascia and rectus muscles were inspected and were hemostatic. The fascia was closed with 0 Vicryl in a running fashion. The subcutaneous layer was irrigated and all bleeders cauterized. The subcutaneous layer was closed with interrupted plain gut. The skin was closed with 3-0 monocryl in a subcuticular fashion. The incision was dressed with benzoine, steri strips and honeycomb dressing. All sponge lap and needle counts were correct x3. Patient tolerated the procedure well and recovered in stable condition following the procedure.

## 2019-06-21 NOTE — Progress Notes (Signed)
Patient has been pushing well for 2.5 hours.  At this time, fetal station is still -1.  The majority of the fetal head is still behind the pubic bone.  Position is LOA and patient pushing with good effort.  Given GDMA2, EFW of 4100-4200 gm and no descent despite excellent maternal effort, I recommend proceeding to the OR for a primary cesarean section for arrest of descent.    We discussed the risks to cesarean section to include infection, bleeding, damage to surrounding structures (including but not limited to bowel, bladder, tubes, ovaries, nerves, vessels, baby), need for blood transfusion, venous thromboembolism, need for additional procedures.  Consent signed

## 2019-06-22 ENCOUNTER — Encounter: Payer: Self-pay | Admitting: *Deleted

## 2019-06-22 LAB — GLUCOSE, CAPILLARY: Glucose-Capillary: 62 mg/dL — ABNORMAL LOW (ref 70–99)

## 2019-06-22 LAB — CBC
HCT: 32.7 % — ABNORMAL LOW (ref 36.0–46.0)
Hemoglobin: 11.1 g/dL — ABNORMAL LOW (ref 12.0–15.0)
MCH: 29.6 pg (ref 26.0–34.0)
MCHC: 33.9 g/dL (ref 30.0–36.0)
MCV: 87.2 fL (ref 80.0–100.0)
Platelets: 230 10*3/uL (ref 150–400)
RBC: 3.75 MIL/uL — ABNORMAL LOW (ref 3.87–5.11)
RDW: 16.2 % — ABNORMAL HIGH (ref 11.5–15.5)
WBC: 16.9 10*3/uL — ABNORMAL HIGH (ref 4.0–10.5)
nRBC: 0 % (ref 0.0–0.2)

## 2019-06-22 NOTE — Progress Notes (Signed)
Patient is eating, ambulating, voiding.  Pain control is good.  Appropriate lochia, no complaints.  Pt feels very well, denies any lightheadedness, fatigue or other symptoms of low blood pressure.  Vitals:   06/21/19 1332 06/21/19 1738 06/21/19 2146 06/22/19 0500  BP: (!) 95/58  (!) 93/51 (!) 89/42  Pulse: 71  63 (!) 56  Resp: 18 18 20 18   Temp: 97.8 F (36.6 C) 98 F (36.7 C) 98.1 F (36.7 C) 98.6 F (37 C)  TempSrc: Oral Oral Oral Oral  SpO2:  95% 100% 100%  Weight:      Height:        Fundus firm Inc: c/d/i Ext: no calf tenderness  Lab Results  Component Value Date   WBC 16.9 (H) 06/22/2019   HGB 11.1 (L) 06/22/2019   HCT 32.7 (L) 06/22/2019   MCV 87.2 06/22/2019   PLT 230 06/22/2019    --/--/O NEG (12/24 0825)  A/P Post op day #1 s/p c/s for arrest of descent. BP low, per pt she runs low usually. Hb normal. Baby doing well.  Routine care.    [name]

## 2019-06-22 NOTE — Anesthesia Postprocedure Evaluation (Signed)
Anesthesia Post Note  Patient: Angela Willis  Procedure(s) Performed: CESAREAN SECTION (N/A )     Patient location during evaluation: Mother Baby Anesthesia Type: Epidural Level of consciousness: awake and alert, oriented and patient cooperative Pain management: pain level controlled Vital Signs Assessment: post-procedure vital signs reviewed and stable Respiratory status: spontaneous breathing Cardiovascular status: stable Postop Assessment: no headache, epidural receding, patient able to bend at knees and no signs of nausea or vomiting Anesthetic complications: no Comments: Pt. States she is walking.  Denies pain.     Last Vitals:  Vitals:   06/21/19 2146 06/22/19 0500  BP: (!) 93/51 (!) 89/42  Pulse: 63 (!) 56  Resp: 20 18  Temp: 36.7 C 37 C  SpO2: 100% 100%    Last Pain:  Vitals:   06/22/19 0500  TempSrc: Oral  PainSc: 0-No pain   Pain Goal:                   [facility]

## 2019-06-22 NOTE — Lactation Note (Signed)
This note was copied from a baby's chart. Lactation Consultation Note  Patient Name: Angela Willis HFSFS'E Date: 06/22/2019 Reason for consult: Follow-up assessment;Primapara;1st time breastfeeding;Term;Infant weight loss  Baby 6 hours old  As LC entered the room, mom holding baby close to breast and baby not latched, just nuzzling fully dressed.  LC offered to check the diaper and undress the baby to see if she would  Latch.  Large wet changed.  LC placed baby STS on the right breast / football/ and attempted to latch.  Baby sucking her upper lip and did not seem interested in feeding.  LC instructed mom on the use of the shells between feedings except when  Sleeping to elongate the nipple / areola complex.  LC also recommended prior to latch breast massage, hand express, pre- pump with hand pump and reverse pressure as shown.  Mom was asking about a Nipple Shield. LC recommended trying the shells and hand pump 1st and if it was still difficult to latch to call for the [facility] and she could be assessed for a NS sizing.  Mom is doing well with hand expressing.  MBURN aware of the [facility] plan.     Maternal Data Has patient been taught Hand Expression?: Yes(mom doing well with hand expressing)  Feeding Feeding Type: Breast Fed  LATCH Score Latch: Repeated attempts needed to sustain latch, nipple held in mouth throughout feeding, stimulation needed to elicit sucking reflex.  Audible Swallowing: None  Type of Nipple: Everted at rest and after stimulation(swelling edema)  Comfort (Breast/Nipple): Soft / non-tender  Hold (Positioning): Assistance needed to correctly position infant at breast and maintain latch.  LATCH Score: 6  Interventions Interventions: Breast feeding basics reviewed;Assisted with latch;Skin to skin;Breast massage;Shells;Hand pump;DEBP  Lactation Tools Discussed/Used Tools: Shells;Pump Shell Type: Inverted Breast pump type: Manual Pump Review: Milk  Storage   Consult Status Consult Status: Follow-up Date: 06/22/19 Follow-up type: In-patient    [place] 06/22/2019, 3:40 PM

## 2019-06-22 NOTE — Lactation Note (Signed)
This note was copied from a baby's chart. Lactation Consultation Note  Patient Name: Angela Willis [id number] Date: 06/22/2019 Reason for consult: Follow-up assessment;Difficult latch   Nurse tech called [place] for consult due to latch difficulty.  Infant sucking her tongue and not bringing tongue down when LC attempted to suck train.  Finally infant did began sucking when LC had colostrum on gloved finger.  Infant attempted to latch and was unable to achieve seal.  LC noted tongue to roof of mouth.  Mom hand expressed; total of 2 ml EBM given with gloved finger during suck training.  NS 24 applied and mom had 8 ml EBM collected in container.  Curved tip syringe used to prefill NS.  Infant latched after a few attempts.  Mom was taught to position pillows and apply shield then use good head and neck support when bringing infant to breast.    Infant latched and sucked actively for 15 minutes. Swallows heard and good rhythmic jaw movements noted.  Infant slowed down at breast, self detached.  Attempted to latch on other side but infant would not latch.  Mom did STS and was planning on pumping after dad returns shortly.  Mom very excited to have infant latch to right side.  LC strongly encouraged mom to pump after each breastfeed and to feed back all her EBM to infant via curved tip syringe or spoon or cup.  And if needed, mom can call out for assistance with feed.  Mom feels comfortable pumping.  Denies questions regarding pump/milk storage.    LC encouraged mom to continue to keep up her milk supply by pumping and hand expressing while working to improve latch.  RN provided with update and moms NS usage.   Maternal Data    Feeding Feeding Type: Breast Milk  LATCH Score Latch: Repeated attempts needed to sustain latch, nipple held in mouth throughout feeding, stimulation needed to elicit sucking reflex.  Audible Swallowing: A few with stimulation  Type of Nipple: Flat(right side  inverts with compression but after feed; nipple everted)  Comfort (Breast/Nipple): Soft / non-tender(right side inverts with compression)  Hold (Positioning): Assistance needed to correctly position infant at breast and maintain latch.  LATCH Score: 6  Interventions Interventions: Breast feeding basics reviewed  Lactation Tools Discussed/Used Tools: Other (comment);Nipple Shields(curved tip syringe) Nipple shield size: 24 Shell Type: Inverted Breast pump type: Double-Electric Breast Pump   Consult Status Consult Status: Follow-up Date: 06/23/19 Follow-up type: In-patient    Ferne Coe [facility] 06/22/2019, 7:25 PM

## 2019-06-23 LAB — GLUCOSE, CAPILLARY
Glucose-Capillary: 52 mg/dL — ABNORMAL LOW (ref 70–99)
Glucose-Capillary: 123 mg/dL — ABNORMAL HIGH (ref 70–99)

## 2019-06-23 MED ORDER — OXYCODONE HCL 5 MG PO TABS: 5.0000 mg | ORAL_TABLET | ORAL | 0 refills | Status: DC | PRN

## 2019-06-23 NOTE — Plan of Care (Signed)
  Problem: Life Cycle: Goal: Chance of risk for complications during the postpartum period will decrease Outcome: Completed/Met   Problem: Role Relationship: Goal: Ability to demonstrate positive interaction with newborn will improve Outcome: Completed/Met   Problem: Skin Integrity: Goal: Demonstration of wound healing without infection will improve Outcome: Completed/Met   

## 2019-06-23 NOTE — Progress Notes (Signed)
Fasting glucose at 08:00 was 52. 8oz grapejuice given, and recheck was 123. Pt's last meal prior to juice was 20:00 the 26th.

## 2019-06-23 NOTE — Discharge Summary (Signed)
Obstetric Discharge Summary Reason for Admission: induction of labor and GDMA2 Prenatal Procedures: ultrasound Intrapartum Procedures: cesarean: low cervical, transverse Postpartum Procedures: none Complications-Operative and Postpartum: none Hemoglobin  Date Value Ref Range Status  06/22/2019 11.1 (L) 12.0 - 15.0 g/dL Final   HCT  Date Value Ref Range Status  06/22/2019 32.7 (L) 36.0 - 46.0 % Final    Physical Exam:  General: alert and cooperative Lochia: appropriate Uterine Fundus: firm Incision: healing well, no significant drainage DVT Evaluation: No evidence of DVT seen on physical exam.  Discharge Diagnoses: Term Pregnancy-delivered  Discharge Information: Date: 06/23/2019 Activity: pelvic rest Diet: routine Medications: PNV, Ibuprofen and oxycodone Condition: stable Instructions: refer to practice specific booklet Discharge to: home Follow-up Information    Angela Charles, MD Follow up in 4 week(s).   Specialty: Obstetrics Contact information: [address] [address] [phone]           Newborn Data: Live born female  Birth Weight: 8 lb 15.2 oz (4060 g) APGAR: 51, 8  Newborn Delivery   Birth date/time: 06/21/2021 05:41:00 Delivery type: C-Section, Classical C-section categorization: Primary      Home with mother.  Angela Willis 06/23/2019, 9:20 AM

## 2019-06-23 NOTE — Lactation Note (Signed)
This note was copied from a baby's chart. Lactation Consultation Note  Patient Name: Angela Willis Today's Date: 06/23/2019 Reason for consult: Infant weight loss;1st time breastfeeding;Primapara;Follow-up assessment;Term  101 hours old FT female who is being partially BF and formula fed by her mother, she's a P1. Mom and baby are going home today, baby is at 7% weight loss. Mom is pumping, she understands the importance of consistent pumping to protect her supply, especially if baby will continue using the NS, she said it helps with the latch; she only pumped once today because she's been waiting for her discharge.  Reviewed discharge instructions, engorgement prevention/treatment, treatment/prevention of sore nipples and red flags on when to call baby's pediatrician. Baby is also on Similac 19 calorie formula, mom told LC baby had 7 ml of breastmilk and 35 ml of formula on her last feeding. Advised parents to follow supplementation guidelines according to baby's age in hours, always offering EBM prior formula.  Mom told LC she's still fine with pumping and bottle feeding if BF doesn't work out, but that she'd still like to provide breastmilk for her baby as much as she can. Encouraged to follow up with lactation, parents reported all questions and concerns were answered, they're both aware of [facility] OP services and will call PRN.   Maternal Data    Feeding    LATCH Score                   Interventions Interventions: Breast feeding basics reviewed;DEBP;Coconut oil  Lactation Tools Discussed/Used Tools: Pump;Nipple Shields;Coconut oil Nipple shield size: 24 Breast pump type: Double-Electric Breast Pump   Consult Status Consult Status: Complete Date: 06/23/19 Follow-up type: Call as needed    [name] [name] 06/23/2019, 11:53 AM

## 2019-06-24 ENCOUNTER — Encounter (HOSPITAL_COMMUNITY): Payer: Self-pay | Admitting: Obstetrics

## 2019-06-30 ENCOUNTER — Encounter: Payer: Self-pay | Admitting: Obstetrics

## 2024-01-03 LAB — HEPATITIS C ANTIBODY: HCV Ab: NEGATIVE

## 2024-01-03 LAB — OB RESULTS CONSOLE ANTIBODY SCREEN: Antibody Screen: NEGATIVE

## 2024-01-03 LAB — OB RESULTS CONSOLE HIV ANTIBODY (ROUTINE TESTING): HIV: NONREACTIVE

## 2024-01-03 LAB — OB RESULTS CONSOLE RUBELLA ANTIBODY, IGM: Rubella: IMMUNE

## 2024-01-03 LAB — OB RESULTS CONSOLE GC/CHLAMYDIA
Chlamydia: NEGATIVE
Neisseria Gonorrhea: NEGATIVE

## 2024-01-03 LAB — OB RESULTS CONSOLE HEPATITIS B SURFACE ANTIGEN: Hepatitis B Surface Ag: NEGATIVE

## 2024-01-03 LAB — OB RESULTS CONSOLE RPR: RPR: NONREACTIVE
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
## 2024-01-28 ENCOUNTER — Encounter (HOSPITAL_COMMUNITY): Payer: Self-pay | Admitting: Obstetrics and Gynecology

## 2024-01-28 ENCOUNTER — Inpatient Hospital Stay (HOSPITAL_COMMUNITY)

## 2024-01-28 ENCOUNTER — Inpatient Hospital Stay (HOSPITAL_COMMUNITY)
Admission: AD | Admit: 2024-01-28 | Discharge: 2024-01-28 | Disposition: A | Discharge status: Home / Self Care | Attending: Obstetrics and Gynecology | Admitting: Obstetrics and Gynecology

## 2024-01-28 ENCOUNTER — Other Ambulatory Visit: Payer: Self-pay

## 2024-01-28 DIAGNOSIS — Z6741 Type O blood, Rh negative: Secondary | ICD-10-CM

## 2024-01-28 DIAGNOSIS — Z3A11 11 weeks gestation of pregnancy: Secondary | ICD-10-CM | POA: Insufficient documentation

## 2024-01-28 DIAGNOSIS — O208 Other hemorrhage in early pregnancy: Secondary | ICD-10-CM

## 2024-01-28 DIAGNOSIS — O09521 Supervision of elderly multigravida, first trimester: Secondary | ICD-10-CM | POA: Insufficient documentation

## 2024-01-28 DIAGNOSIS — N8312 Corpus luteum cyst of left ovary: Secondary | ICD-10-CM | POA: Insufficient documentation

## 2024-01-28 DIAGNOSIS — O3481 Maternal care for other abnormalities of pelvic organs, first trimester: Secondary | ICD-10-CM | POA: Diagnosis not present

## 2024-01-28 DIAGNOSIS — O26851 Spotting complicating pregnancy, first trimester: Secondary | ICD-10-CM | POA: Insufficient documentation

## 2024-01-28 DIAGNOSIS — O209 Hemorrhage in early pregnancy, unspecified: Secondary | ICD-10-CM

## 2024-01-28 LAB — CBC WITH DIFFERENTIAL/PLATELET
Abs Immature Granulocytes: 0.09 K/uL — ABNORMAL HIGH (ref 0.00–0.07)
Basophils Absolute: 0 K/uL (ref 0.0–0.1)
Basophils Relative: 0 %
Eosinophils Absolute: 0.1 K/uL (ref 0.0–0.5)
Eosinophils Relative: 1 %
HCT: 41.9 % (ref 36.0–46.0)
Hemoglobin: 14.6 g/dL (ref 12.0–15.0)
Immature Granulocytes: 1 %
Lymphocytes Relative: 16 %
Lymphs Abs: 2.5 K/uL (ref 0.7–4.0)
MCH: 30.2 pg (ref 26.0–34.0)
MCHC: 34.8 g/dL (ref 30.0–36.0)
MCV: 86.6 fL (ref 80.0–100.0)
Monocytes Absolute: 0.8 K/uL (ref 0.1–1.0)
Monocytes Relative: 5 %
Neutro Abs: 12.6 K/uL — ABNORMAL HIGH (ref 1.7–7.7)
Neutrophils Relative %: 77 %
Platelets: 287 K/uL (ref 150–400)
RBC: 4.84 MIL/uL (ref 3.87–5.11)
RDW: 12.8 % (ref 11.5–15.5)
WBC: 16.2 K/uL — ABNORMAL HIGH (ref 4.0–10.5)
nRBC: 0 % (ref 0.0–0.2)

## 2024-01-28 LAB — COMPREHENSIVE METABOLIC PANEL WITH GFR
ALT: 14 U/L (ref 0–44)
AST: 18 U/L (ref 15–41)
Albumin: 3.6 g/dL (ref 3.5–5.0)
Alkaline Phosphatase: 47 U/L (ref 38–126)
Anion gap: 8 (ref 5–15)
BUN: 5 mg/dL — ABNORMAL LOW (ref 6–20)
CO2: 19 mmol/L — ABNORMAL LOW (ref 22–32)
Calcium: 8.8 mg/dL — ABNORMAL LOW (ref 8.9–10.3)
Chloride: 106 mmol/L (ref 98–111)
Creatinine, Ser: 0.63 mg/dL (ref 0.44–1.00)
GFR, Estimated: >60 mL/min (ref >60)
Glucose, Bld: 118 mg/dL — ABNORMAL HIGH (ref 70–99)
Potassium: 3.7 mmol/L (ref 3.5–5.1)
Sodium: 133 mmol/L — ABNORMAL LOW (ref 135–145)
Total Bilirubin: 0.5 mg/dL (ref 0.0–1.2)
Total Protein: 6.5 g/dL (ref 6.5–8.1)

## 2024-01-28 LAB — HCG, QUANTITATIVE, PREGNANCY: hCG, Beta Chain, Quant, S: 163313 m[IU]/mL — ABNORMAL HIGH (ref <5)

## 2024-01-28 MED ORDER — RHO D IMMUNE GLOBULIN 1500 UNIT/2ML IJ SOSY
300.0000 ug | PREFILLED_SYRINGE | Freq: Once | INTRAMUSCULAR | Status: AC
  Administered 2024-01-28: 300 ug via INTRAMUSCULAR
  Filled 2024-01-28: qty 2

## 2024-01-28 NOTE — MAU Note (Signed)
 Angela Willis is a 33 y.o. at Unknown here in MAU reporting: she began spotting yesterday and it's continued to day, reports passed 1 quarter sized clot this morning.  Denies recent intercourse.  Denies current pain, states has nausea.  LMP: 11/11/23 Onset of complaint: yesterday Pain score: 0 Vitals:   01/28/24 0908  BP: 117/60  Pulse: 78  Temp: 97.7 F (36.5 C)  SpO2: 99%

## 2024-01-28 NOTE — MAU Provider Note (Addendum)
 Attestation of Supervision of Student:  I confirm that I have verified the information documented in the Resident'snote and that I have also personally reperformed the history, physical exam and all medical decision making activities.  I have verified that all services and findings are accurately documented in this student's note; and I agree with management and plan as outlined in the documentation. I have also made any necessary editorial changes.   Delon Emms, NP [facility] [employer], [facility] 01/28/2024 2:36 PM           History     CSN: [id number]  Arrival date and time: 01/28/24 9148   Event Date/Time   First Provider Initiated Contact with Patient 01/28/2024  9:11 AM   No chief complaint on file.   HPI  Angela Willis is a 33 y.o. G2P1001 at [redacted]w[redacted]d who presents to the MAU for vaginal bleeding and passing a clot. Patient reports light bleeding yesterday around 1830 after 30 minutes of swimming. Bleeding continued into the evening with scant blood seen on a pad. Patient used a home doppler and did find a heartbeat. This morning, patient passed a quarter-sized clot which prompted her visit to the MAU. Patient denies any strenuous physical activity or sexual intercourse in the past few days. She is nauseous but denies any cramping.  Past Medical History:  Diagnosis Date   ADHD    dx in childhood    Chronic salpingitis    left    Gestational diabetes    Headache    occ    PCOS (polycystic ovarian syndrome)    PCOS (polycystic ovarian syndrome)     Past Surgical History:  Procedure Laterality Date   CESAREAN SECTION N/A 06/21/2019   Procedure: CESAREAN SECTION;  Surgeon: Gretta Gums, MD;  Location: [facility]S;  Service: Obstetrics;  Laterality: N/A;   LAPAROSCOPIC BILATERAL SALPINGECTOMY Left 09/29/2017   Procedure: LAPAROSCOPIC LEFT SAPINGECTOMY, EXCISION AND ABLATION ENDOMETRIOSIS LYSIS OF ADHESIONS;  Surgeon: Yalcinkaya, Tamer, MD;   Location: [facility];  Service: Gynecology;  Laterality: Left;   TONSILLECTOMY  age 23     Family History  Problem Relation Age of Onset   Thyroid disease Mother    Diabetes Maternal Grandmother     Social History   Tobacco Use   Smoking status: Former    Current packs/day: 0.00    Average packs/day: 3.0 packs/day for 4.0 years (12.0 ttl pk-yrs)    Types: Cigarettes    Start date: 09/22/2010    Quit date: 09/22/2014    Years since quitting: 9.3   Smokeless tobacco: Never  Vaping Use   Vaping status: Never Used  Substance Use Topics   Alcohol use: No   Drug use: No    Allergies:  Allergies  Allergen Reactions   Hydrocodone Nausea Only    N/V with cough syrup    Tessalon Perles [Benzonatate]     N/V     Medications Prior to Admission  Medication Sig Dispense Refill Last Dose/Taking   oxyCODONE  (OXY IR/ROXICODONE ) 5 MG immediate release tablet Take 1-2 tablets (5-10 mg total) by mouth every 4 (four) hours as needed for moderate pain. 20 tablet 0    Prenatal MV & Min w/FA-DHA ([employer] PRENATAL GUMMY PO) Take by mouth. Takes 2 gummies once a day       ROS reviewed and pertinent positives and negatives as documented in HPI.  Physical Exam   Blood pressure (!) 110/54, pulse 66, temperature 98 F (  36.7 C), temperature source Oral, height 5' 3 (1.6 m), weight 81.9 kg, last menstrual period 11/11/2023, SpO2 100%, not currently breastfeeding.  Physical Exam General: Alert, well-appearing female HEENT: Normocephalic, atraumatic, EOM grossly intact Respiratory: Breathing comfortably on room air Extremities: Moves all four extremities appropriately, normal gait. Neuro: No obvious focal deficits Skin: No lesions/rashes visualized Psych: Anxious, tearful  MAU Course  Procedures  MDM 33 y.o. G2P1001 at [redacted]w[redacted]d presenting for vaginal bleeding with clot. On exam, patient is hemodynamically stable on room air. No cramping. Transvaginal ultrasound shows small  subchorionic hemorrhage with single live IUP with EGA [redacted]w[redacted]d and 3.1 cyst abutting left ovary. Administered Rhogam given patient blood type O negative. Provided education on subchorionic hemorrhage and advised pelvic rest and avoiding tampon use and sexual hemorrhage.  Assessment and Plan     ICD-10-CM   1. Vaginal bleeding before [redacted] weeks gestation  O20.9     2. Subchorionic hemorrhage in first trimester  O20.8     3. [redacted] weeks gestation of pregnancy  Z3A.11       - Administered Rhogam - Advised pelvic rest  Virali Bhagat, DO 01/28/2024, 11:08 AM

## 2024-01-29 LAB — RH IG WORKUP (INCLUDES ABO/RH)
ABO/RH(D): O NEG
Antibody Screen: NEGATIVE
Gestational Age(Wks): 11
Unit division: 0

## 2024-04-25 ENCOUNTER — Other Ambulatory Visit: Payer: Self-pay | Admitting: Obstetrics

## 2024-04-25 ENCOUNTER — Telehealth: Payer: Self-pay

## 2024-04-25 DIAGNOSIS — O365990 Maternal care for other known or suspected poor fetal growth, unspecified trimester, not applicable or unspecified: Secondary | ICD-10-CM

## 2024-05-21 ENCOUNTER — Ambulatory Visit

## 2024-05-21 ENCOUNTER — Other Ambulatory Visit

## 2024-06-07 NOTE — Progress Notes (Unsigned)
°  Class start Time: 1455   Class End Time: 1625  This was a class of  four patients.   Patient was seen on 06/12/2024 for Gestational Diabetes self-management class at the [facility]. The following learning objectives were met by the patient during this course:  States the definition of Gestational Diabetes States why dietary management is important in controlling blood glucose Describes the effects each nutrient has on blood glucose levels Demonstrates ability to create a balanced meal plan Demonstrates carbohydrate counting  States when to check blood glucose levels Demonstrates proper blood glucose monitoring techniques States the effect of stress and exercise on blood glucose levels States the importance of limiting caffeine and abstaining from alcohol and smoking   Accu chek- Patient has a meter prior to visit. Patient is instructed to test pre breakfast and 2 hours after each meal. Blood glucose today in class 87 mg/dL, reported as 2 hour post prandial per Pt  Patient instructed to monitor glucose levels:  QID FBS: 60 - <95 2 hour: <120  *Patient received handouts: Nutrition Diabetes and Pregnancy Carbohydrate Counting List Blood glucose log Snack ideas for diabetes during pregnancy Plate Planner  Patient will be seen for follow-up as needed.

## 2024-06-12 ENCOUNTER — Encounter

## 2024-06-12 DIAGNOSIS — O24410 Gestational diabetes mellitus in pregnancy, diet controlled: Secondary | ICD-10-CM | POA: Diagnosis present

## 2024-06-28 ENCOUNTER — Inpatient Hospital Stay (HOSPITAL_COMMUNITY)
Admission: AD | Admit: 2024-06-28 | Discharge: 2024-07-06 | DRG: 787 | Disposition: A | Attending: Obstetrics and Gynecology | Admitting: Obstetrics and Gynecology

## 2024-06-28 ENCOUNTER — Encounter (HOSPITAL_COMMUNITY): Payer: Self-pay | Admitting: Obstetrics and Gynecology

## 2024-06-28 DIAGNOSIS — O9962 Diseases of the digestive system complicating childbirth: Secondary | ICD-10-CM | POA: Diagnosis present

## 2024-06-28 DIAGNOSIS — Z833 Family history of diabetes mellitus: Secondary | ICD-10-CM

## 2024-06-28 DIAGNOSIS — O99213 Obesity complicating pregnancy, third trimester: Secondary | ICD-10-CM

## 2024-06-28 DIAGNOSIS — O99214 Obesity complicating childbirth: Secondary | ICD-10-CM | POA: Diagnosis present

## 2024-06-28 DIAGNOSIS — O09523 Supervision of elderly multigravida, third trimester: Secondary | ICD-10-CM

## 2024-06-28 DIAGNOSIS — O24425 Gestational diabetes mellitus in childbirth, controlled by oral hypoglycemic drugs: Secondary | ICD-10-CM | POA: Diagnosis present

## 2024-06-28 DIAGNOSIS — O42113 Preterm premature rupture of membranes, onset of labor more than 24 hours following rupture, third trimester: Principal | ICD-10-CM | POA: Diagnosis present

## 2024-06-28 DIAGNOSIS — O3663X0 Maternal care for excessive fetal growth, third trimester, not applicable or unspecified: Secondary | ICD-10-CM | POA: Diagnosis present

## 2024-06-28 DIAGNOSIS — Z6791 Unspecified blood type, Rh negative: Secondary | ICD-10-CM

## 2024-06-28 DIAGNOSIS — Z98891 History of uterine scar from previous surgery: Principal | ICD-10-CM

## 2024-06-28 DIAGNOSIS — Z3A33 33 weeks gestation of pregnancy: Secondary | ICD-10-CM

## 2024-06-28 DIAGNOSIS — O26893 Other specified pregnancy related conditions, third trimester: Secondary | ICD-10-CM | POA: Diagnosis present

## 2024-06-28 DIAGNOSIS — O365930 Maternal care for other known or suspected poor fetal growth, third trimester, not applicable or unspecified: Secondary | ICD-10-CM | POA: Diagnosis present

## 2024-06-28 DIAGNOSIS — Z87891 Personal history of nicotine dependence: Secondary | ICD-10-CM

## 2024-06-28 DIAGNOSIS — O99824 Streptococcus B carrier state complicating childbirth: Secondary | ICD-10-CM | POA: Diagnosis present

## 2024-06-28 DIAGNOSIS — O42919 Preterm premature rupture of membranes, unspecified as to length of time between rupture and onset of labor, unspecified trimester: Principal | ICD-10-CM | POA: Diagnosis present

## 2024-06-28 DIAGNOSIS — K219 Gastro-esophageal reflux disease without esophagitis: Secondary | ICD-10-CM | POA: Diagnosis present

## 2024-06-28 DIAGNOSIS — O34211 Maternal care for low transverse scar from previous cesarean delivery: Secondary | ICD-10-CM | POA: Diagnosis present

## 2024-06-28 DIAGNOSIS — O4103X0 Oligohydramnios, third trimester, not applicable or unspecified: Secondary | ICD-10-CM | POA: Diagnosis present

## 2024-06-28 DIAGNOSIS — O328XX0 Maternal care for other malpresentation of fetus, not applicable or unspecified: Secondary | ICD-10-CM | POA: Diagnosis present

## 2024-06-28 DIAGNOSIS — E66813 Obesity, class 3: Secondary | ICD-10-CM | POA: Diagnosis present

## 2024-06-28 DIAGNOSIS — O280 Abnormal hematological finding on antenatal screening of mother: Secondary | ICD-10-CM

## 2024-06-28 NOTE — MAU Note (Signed)
 Pt says at 930pm- was in bathtub - felt a pop- stood- and had water and mucus come out - clear. Has happened x2 since then . No UVC's  Feels baby moving

## 2024-06-29 ENCOUNTER — Encounter (HOSPITAL_COMMUNITY): Payer: Self-pay | Admitting: Obstetrics and Gynecology

## 2024-06-29 ENCOUNTER — Inpatient Hospital Stay (HOSPITAL_COMMUNITY)

## 2024-06-29 DIAGNOSIS — O99824 Streptococcus B carrier state complicating childbirth: Secondary | ICD-10-CM | POA: Diagnosis present

## 2024-06-29 DIAGNOSIS — O09523 Supervision of elderly multigravida, third trimester: Secondary | ICD-10-CM

## 2024-06-29 DIAGNOSIS — O9962 Diseases of the digestive system complicating childbirth: Secondary | ICD-10-CM | POA: Diagnosis present

## 2024-06-29 DIAGNOSIS — Z6791 Unspecified blood type, Rh negative: Secondary | ICD-10-CM | POA: Diagnosis not present

## 2024-06-29 DIAGNOSIS — O24415 Gestational diabetes mellitus in pregnancy, controlled by oral hypoglycemic drugs: Secondary | ICD-10-CM | POA: Diagnosis not present

## 2024-06-29 DIAGNOSIS — E66813 Obesity, class 3: Secondary | ICD-10-CM | POA: Diagnosis present

## 2024-06-29 DIAGNOSIS — O328XX0 Maternal care for other malpresentation of fetus, not applicable or unspecified: Secondary | ICD-10-CM | POA: Diagnosis present

## 2024-06-29 DIAGNOSIS — O365930 Maternal care for other known or suspected poor fetal growth, third trimester, not applicable or unspecified: Secondary | ICD-10-CM | POA: Diagnosis present

## 2024-06-29 DIAGNOSIS — O34219 Maternal care for unspecified type scar from previous cesarean delivery: Secondary | ICD-10-CM | POA: Diagnosis not present

## 2024-06-29 DIAGNOSIS — O99213 Obesity complicating pregnancy, third trimester: Secondary | ICD-10-CM

## 2024-06-29 DIAGNOSIS — O4103X0 Oligohydramnios, third trimester, not applicable or unspecified: Secondary | ICD-10-CM | POA: Diagnosis present

## 2024-06-29 DIAGNOSIS — O3663X0 Maternal care for excessive fetal growth, third trimester, not applicable or unspecified: Secondary | ICD-10-CM | POA: Diagnosis present

## 2024-06-29 DIAGNOSIS — O42113 Preterm premature rupture of membranes, onset of labor more than 24 hours following rupture, third trimester: Secondary | ICD-10-CM

## 2024-06-29 DIAGNOSIS — O99892 Other specified diseases and conditions complicating childbirth: Secondary | ICD-10-CM | POA: Diagnosis not present

## 2024-06-29 DIAGNOSIS — O24425 Gestational diabetes mellitus in childbirth, controlled by oral hypoglycemic drugs: Secondary | ICD-10-CM | POA: Diagnosis present

## 2024-06-29 DIAGNOSIS — K219 Gastro-esophageal reflux disease without esophagitis: Secondary | ICD-10-CM | POA: Diagnosis present

## 2024-06-29 DIAGNOSIS — O34211 Maternal care for low transverse scar from previous cesarean delivery: Secondary | ICD-10-CM | POA: Diagnosis present

## 2024-06-29 DIAGNOSIS — O42913 Preterm premature rupture of membranes, unspecified as to length of time between rupture and onset of labor, third trimester: Secondary | ICD-10-CM | POA: Diagnosis present

## 2024-06-29 DIAGNOSIS — Z833 Family history of diabetes mellitus: Secondary | ICD-10-CM | POA: Diagnosis not present

## 2024-06-29 DIAGNOSIS — Z3A33 33 weeks gestation of pregnancy: Secondary | ICD-10-CM

## 2024-06-29 DIAGNOSIS — O360130 Maternal care for anti-D [Rh] antibodies, third trimester, not applicable or unspecified: Secondary | ICD-10-CM | POA: Diagnosis not present

## 2024-06-29 DIAGNOSIS — O26893 Other specified pregnancy related conditions, third trimester: Secondary | ICD-10-CM | POA: Diagnosis present

## 2024-06-29 DIAGNOSIS — O280 Abnormal hematological finding on antenatal screening of mother: Secondary | ICD-10-CM

## 2024-06-29 DIAGNOSIS — R001 Bradycardia, unspecified: Secondary | ICD-10-CM | POA: Diagnosis not present

## 2024-06-29 DIAGNOSIS — Z87891 Personal history of nicotine dependence: Secondary | ICD-10-CM | POA: Diagnosis not present

## 2024-06-29 DIAGNOSIS — O368330 Maternal care for abnormalities of the fetal heart rate or rhythm, third trimester, not applicable or unspecified: Secondary | ICD-10-CM | POA: Diagnosis not present

## 2024-06-29 DIAGNOSIS — O99214 Obesity complicating childbirth: Secondary | ICD-10-CM | POA: Diagnosis present

## 2024-06-29 LAB — GLUCOSE, CAPILLARY
Glucose-Capillary: 183 mg/dL — ABNORMAL HIGH (ref 70–99)
Glucose-Capillary: 117 mg/dL — ABNORMAL HIGH (ref 70–99)
Glucose-Capillary: 129 mg/dL — ABNORMAL HIGH (ref 70–99)

## 2024-06-29 LAB — CBC
HCT: 34.5 % — ABNORMAL LOW (ref 36.0–46.0)
Hemoglobin: 11.7 g/dL — ABNORMAL LOW (ref 12.0–15.0)
MCH: 29.6 pg (ref 26.0–34.0)
MCHC: 33.9 g/dL (ref 30.0–36.0)
MCV: 87.3 fL (ref 80.0–100.0)
Platelets: 280 K/uL (ref 150–400)
RBC: 3.95 MIL/uL (ref 3.87–5.11)
RDW: 13.8 % (ref 11.5–15.5)
WBC: 15.3 K/uL — ABNORMAL HIGH (ref 4.0–10.5)
nRBC: 0.1 % (ref 0.0–0.2)

## 2024-06-29 LAB — TYPE AND SCREEN
ABO/RH(D): O NEG
Antibody Screen: POSITIVE

## 2024-06-29 LAB — COMPREHENSIVE METABOLIC PANEL WITH GFR
ALT: 21 U/L (ref 0–44)
AST: 22 U/L (ref 15–41)
Albumin: 3.2 g/dL — ABNORMAL LOW (ref 3.5–5.0)
Alkaline Phosphatase: 95 U/L (ref 38–126)
Anion gap: 11 (ref 5–15)
BUN: 8 mg/dL (ref 6–20)
CO2: 21 mmol/L — ABNORMAL LOW (ref 22–32)
Calcium: 9.2 mg/dL (ref 8.9–10.3)
Chloride: 105 mmol/L (ref 98–111)
Creatinine, Ser: 0.58 mg/dL (ref 0.44–1.00)
GFR, Estimated: >60 mL/min
Glucose, Bld: 113 mg/dL — ABNORMAL HIGH (ref 70–99)
Potassium: 4.2 mmol/L (ref 3.5–5.1)
Sodium: 137 mmol/L (ref 135–145)
Total Bilirubin: 0.3 mg/dL (ref 0.0–1.2)
Total Protein: 5.7 g/dL — ABNORMAL LOW (ref 6.5–8.1)

## 2024-06-29 LAB — POCT FERN TEST: POCT Fern Test: POSITIVE

## 2024-06-29 MED ORDER — BETAMETHASONE SOD PHOS & ACET 6 (3-3) MG/ML IJ SUSP
12.0000 mg | INTRAMUSCULAR | Status: AC
  Administered 2024-06-29 – 2024-06-30 (×2): 12 mg via INTRAMUSCULAR
  Filled 2024-06-29 (×2): qty 5

## 2024-06-29 MED ORDER — LACTATED RINGERS IV SOLN: INTRAVENOUS | Status: DC

## 2024-06-29 MED ORDER — SODIUM CHLORIDE 0.9 % IV SOLN
2.0000 g | Freq: Four times a day (QID) | INTRAVENOUS | Status: AC
  Administered 2024-06-29 – 2024-06-30 (×8): 2 g via INTRAVENOUS
  Filled 2024-06-29 (×8): qty 2000

## 2024-06-29 MED ORDER — FAMOTIDINE 20 MG PO TABS
20.0000 mg | ORAL_TABLET | Freq: Two times a day (BID) | ORAL | Status: DC
  Administered 2024-06-29 – 2024-07-04 (×12): 20 mg via ORAL
  Filled 2024-06-29 (×13): qty 1

## 2024-06-29 MED ORDER — GLYBURIDE 2.5 MG PO TABS
2.5000 mg | ORAL_TABLET | Freq: Every day | ORAL | Status: DC
  Administered 2024-06-29 – 2024-07-04 (×6): 2.5 mg via ORAL
  Filled 2024-06-29 (×8): qty 1

## 2024-06-29 MED ORDER — BETAMETHASONE SOD PHOS & ACET 6 (3-3) MG/ML IJ SUSP: 12.0000 mg | INTRAMUSCULAR | Status: DC

## 2024-06-29 MED ORDER — LACTATED RINGERS IV SOLN: INTRAVENOUS | Status: AC

## 2024-06-29 MED ORDER — CALCIUM CARBONATE ANTACID 500 MG PO CHEW
2.0000 | CHEWABLE_TABLET | ORAL | Status: DC | PRN
  Administered 2024-06-29: 400 mg via ORAL
  Filled 2024-06-29: qty 2

## 2024-06-29 MED ORDER — SODIUM CHLORIDE 0.9% FLUSH
3.0000 mL | Freq: Two times a day (BID) | INTRAVENOUS | Status: DC
  Administered 2024-06-29 – 2024-07-04 (×10): 3 mL via INTRAVENOUS

## 2024-06-29 MED ORDER — MAGNESIUM SULFATE 40 GM/1000ML IV SOLN: 2.0000 g/h | INTRAVENOUS | Status: DC

## 2024-06-29 MED ORDER — ACETAMINOPHEN 325 MG PO TABS
650.0000 mg | ORAL_TABLET | ORAL | Status: DC | PRN
  Administered 2024-06-29: 650 mg via ORAL
  Filled 2024-06-29: qty 2

## 2024-06-29 MED ORDER — SODIUM CHLORIDE 0.9% FLUSH: 3.0000 mL | INTRAVENOUS | Status: DC | PRN

## 2024-06-29 MED ORDER — LACTATED RINGERS IV BOLUS: 1000.0000 mL | Freq: Once | INTRAVENOUS | Status: DC

## 2024-06-29 MED ORDER — ASPIRIN 81 MG PO CHEW
81.0000 mg | CHEWABLE_TABLET | Freq: Every day | ORAL | Status: DC
  Administered 2024-06-29 – 2024-07-04 (×6): 81 mg via ORAL
  Filled 2024-06-29 (×6): qty 1

## 2024-06-29 MED ORDER — AMOXICILLIN 500 MG PO CAPS
500.0000 mg | ORAL_CAPSULE | Freq: Three times a day (TID) | ORAL | Status: DC
  Administered 2024-07-01 – 2024-07-04 (×13): 500 mg via ORAL
  Filled 2024-06-29 (×14): qty 1

## 2024-06-29 MED ORDER — SODIUM CHLORIDE 0.9 % IV SOLN: 250.0000 mL | INTRAVENOUS | Status: AC | PRN

## 2024-06-29 MED ORDER — AZITHROMYCIN 250 MG PO TABS
1000.0000 mg | ORAL_TABLET | Freq: Once | ORAL | Status: AC
  Administered 2024-06-29: 1000 mg via ORAL
  Filled 2024-06-29: qty 4

## 2024-06-29 MED ORDER — MAGNESIUM SULFATE BOLUS VIA INFUSION
4.0000 g | Freq: Once | INTRAVENOUS | Status: AC
  Administered 2024-06-29: 4 g via INTRAVENOUS
  Filled 2024-06-29: qty 1000

## 2024-06-29 MED ORDER — LACTATED RINGERS IV SOLN: 125.0000 mL/h | INTRAVENOUS | Status: AC

## 2024-06-29 MED ORDER — COMPLETENATE 29-1 MG PO CHEW
1.0000 | CHEWABLE_TABLET | Freq: Every day | ORAL | Status: DC
  Administered 2024-06-29 – 2024-07-04 (×6): 1 via ORAL
  Filled 2024-06-29 (×7): qty 1

## 2024-06-29 MED ORDER — MAGNESIUM SULFATE 40 GM/1000ML IV SOLN
2.0000 g/h | INTRAVENOUS | Status: AC
  Administered 2024-06-29: 2 g/h via INTRAVENOUS
  Filled 2024-06-29: qty 1000

## 2024-06-29 MED ORDER — MAGNESIUM SULFATE BOLUS VIA INFUSION: 4.0000 g | Freq: Once | INTRAVENOUS | Status: DC

## 2024-06-29 MED ORDER — DOCUSATE SODIUM 100 MG PO CAPS
100.0000 mg | ORAL_CAPSULE | Freq: Every day | ORAL | Status: DC
  Administered 2024-06-29 – 2024-07-04 (×6): 100 mg via ORAL
  Filled 2024-06-29 (×6): qty 1

## 2024-06-29 NOTE — Consult Note (Signed)
 MFM Consult Note  Angela Willis is a 34 year old gravida 2 para 1 currently at 33 weeks and 0 days.  She was seen in consultation at the request of Dr. Delana due to [facility].    The patient was admitted earlier this morning after rupture of membranes was confirmed.  Due to PPROM, she is receiving a complete course of antenatal corticosteroids and magnesium  sulfate.  Her current pregnancy has been complicated by maternal obesity with a BMI of 42 and gestational diabetes treated with glyburide .    She has a history of a prior cesarean delivery.  Her cell free DNA test indicated a high risk for trisomy 16.  IUGR was noted earlier in her current pregnancy.    She has been followed by MFM at [place] [facility] with serial growth ultrasounds.  She has declined an amniocentesis for definitive prenatal diagnosis of fetal aneuploidy.  Her most recent ultrasound performed at [facility] 10 days ago showed an overall EFW of 3 pounds 9 ounces which measured at the 14th percentile for her gestational age, indicating that IUGR has resolved.    Her umbilical artery Doppler studies were also within normal limits, without any signs of absent or reversed end-diastolic flow.  She also had a normal fetal echocardiogram performed with [facility] pediatric cardiology.  Currently, the patient reports that she is still leaking a small amount of fluid.   Her vital signs have been stable and she is afebrile.   Her fetal status has been reassuring.  A limited ultrasound performed this morning shows oligohydramnios with a total AFI of 3.8 cm.  The fetus is in the vertex presentation.  PPROM  The usual management and implications of PPROM were discussed with the patient.    She was advised that due to rupture of membranes, she will require inpatient management until delivery with daily fetal testing.  She should receive a complete a course of latency antibiotics.    Due to PPROM, delivery is recommended at around  34 weeks.   Delivery prior to 34 weeks will be indicated: should she go into spontaneous labor should she experience heavy vaginal bleeding should she show any signs of an intrauterine infection at any time for nonreassuring fetal status  As she is now at 33 weeks, magnesium  sulfate for fetal neuroprotection will not be necessary prior to delivery.  The patient was reassured that most babies delivered at around 34 weeks following PPROM generally do well.  However, her baby will require a NICU admission following delivery.  The patient understands that her blood sugars may be elevated following the administration of antenatal corticosteroids.  She should continue glyburide  for treatment.  Prior cesarean delivery  The patient reports that the indication for her first cesarean delivery was due to a large for gestational age fetus (99th percentile) and arrest of descent.  The patient would like to attempt a VBAC/TOLAC if possible.  She was advised that as this baby will be much smaller than her first child and as the fetus is in the vertex presentation, she may attempt a VBAC/TOLAC if she desires.  The small risk of uterine rupture (1% or less) associated with a VBAC/TOLAC was discussed.  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  At the end of the consultation, the patient stated that all her questions had been answered to her complete satisfaction.    A total of 60  minutes was spent counseling, reviewing her chart, documenting, and coordinating the care for this patient.

## 2024-06-29 NOTE — MAU Provider Note (Signed)
 " History     CSN: [id number]  Arrival date and time: 06/28/24 2325   Event Date/Time   First Provider Initiated Contact with Patient 06/29/24 0010      Chief Complaint  Patient presents with   Rupture of Membranes   Angela Willis , a  34 y.o. G2P1001 at [redacted]w[redacted]d presents to MAU with complaints of leaking of fluid. She reports was in the bathroom and felt a pop. She stood up and had fluid come out and noted mucus in the tub. She reports 2 additional gushes since then. She denies vaginal bleeding, and contractions. She endorses positive fetal movement.          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    Chronic salpingitis    left    Gestational diabetes    Headache    occ    PCOS (polycystic ovarian syndrome)    PCOS (polycystic ovarian syndrome)     Past Surgical History:  Procedure Laterality Date   CESAREAN SECTION N/A 06/21/2019   Procedure: CESAREAN SECTION;  Surgeon: Gretta Gums, MD;  Location: [facility];  Service: Obstetrics;  Laterality: N/A;   LAPAROSCOPIC BILATERAL SALPINGECTOMY Left 09/29/2017   Procedure: LAPAROSCOPIC LEFT SAPINGECTOMY, EXCISION AND ABLATION ENDOMETRIOSIS LYSIS OF ADHESIONS;  Surgeon: Yalcinkaya, Tamer, MD;  Location: [facility];  Service: Gynecology;  Laterality: Left;   TONSILLECTOMY  age 11     Family History  Problem Relation Age of Onset   Thyroid disease Mother    Diabetes Maternal Grandmother     Social History[1]  Allergies: Allergies[2]  Medications Prior to Admission  Medication Sig Dispense Refill Last Dose/Taking   aspirin  EC 81 MG tablet Take 81 mg by mouth daily. Swallow whole.   06/28/2024   famotidine  (PEPCID ) 20 MG tablet Take 20 mg by mouth 2 (two) times daily.   Taking   Fish  Oil-Cholecalciferol (FISH OIL + D3) 1200-1000 MG-UNIT CAPS Take 1 tablet by mouth daily.   Taking   Prenatal MV & Min w/FA-DHA ([employer] PRENATAL GUMMY PO) Take by mouth. Takes 2 gummies once a day   06/28/2024    Review of Systems  Constitutional:  Negative for chills, fatigue and fever.  Eyes:  Negative for pain and visual disturbance.  Respiratory:  Negative for apnea, shortness of breath and wheezing.   Cardiovascular:  Negative for chest pain and palpitations.  Gastrointestinal:  Negative for abdominal pain, constipation, diarrhea, nausea and vomiting.  Genitourinary:  Positive for vaginal discharge. Negative for difficulty urinating, dysuria, pelvic pain, vaginal bleeding and vaginal pain.  Musculoskeletal:  Negative for back pain.  Neurological:  Negative for seizures, weakness and headaches.  Psychiatric/Behavioral:  Negative for suicidal ideas.    Physical Exam   Blood pressure (!) 121/54, pulse 96, temperature 97.7 F (36.5 C), temperature source Oral, resp. rate 12, height 5' 3 (1.6 m), weight 107.6 kg, last menstrual period 11/11/2023.  Physical Exam Vitals and nursing note reviewed. Chaperone present: LOIS Blush RN.  Constitutional:      General: She is not in acute distress.    Appearance: Normal appearance.  HENT:     Head: Normocephalic.  Pulmonary:     Effort: Pulmonary effort is normal.  Abdominal:     Palpations: Abdomen is soft.     Tenderness: There is no abdominal tenderness.  Musculoskeletal:     Cervical back: Normal range of motion.  Skin:    General: Skin is warm and dry.  Neurological:     Mental Status: She is alert and oriented to person, place, and time.  Psychiatric:        Mood and Affect: Mood normal.    FHT: 140 bpm with min variability no accels and short variable decles with quick return to baseline. Position changed and plan to reassess. IV fluids and BMZ ordered.  MAU Course  Procedures  MDM - Fern Positive and grossly ruptured.  -  Discussed admission with Dr. Delana.  - Plan to admit to [facility]. MD made aware of tracing.   Assessment and Plan  - Admit to OBCS   Claris CHRISTELLA Cedar, MSN CNM  06/29/2024, 12:10 AM      [1]  Social History Tobacco Use   Smoking status: Former    Current packs/day: 0.00    Average packs/day: 3.0 packs/day for 4.0 years (12.0 ttl pk-yrs)    Types: Cigarettes    Start date: 09/22/2010    Quit date: 09/22/2014    Years since quitting: 9.7   Smokeless tobacco: Never  Vaping Use   Vaping status: Never Used  Substance Use Topics   Alcohol use: No   Drug use: No  [2]  Allergies Allergen Reactions   Hydrocodone Nausea Only    N/V with cough syrup    Tessalon Perles [Benzonatate]     N/V    "

## 2024-06-29 NOTE — H&P (Signed)
 Angela Willis is a 34 y.o. G44P1001 female who is admitted for PPROM Pt reports hearing a pop sound and then feeling a gush of fluid while taking a bath last night. In MAU she was diagnosed with PPROM. She was not having any contractions or discomfort. No fever or chills Her pregnancy has been complicated by the following: Fetal growth restriction- noted at 23 weeks but resolved at 32 week. 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 Treated with glyburide  2.5mg  daily. 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    Chronic salpingitis    left    Gestational diabetes    Headache    occ    PCOS (polycystic ovarian syndrome)    PCOS (polycystic ovarian syndrome)    Past Surgical History:  Procedure Laterality Date   CESAREAN SECTION N/A 06/21/2019   Procedure: CESAREAN SECTION;  Surgeon: Gretta Gums, MD;  Location: [facility];  Service: Obstetrics;  Laterality: N/A;   LAPAROSCOPIC BILATERAL SALPINGECTOMY Left 09/29/2017   Procedure: LAPAROSCOPIC LEFT SAPINGECTOMY, EXCISION AND ABLATION ENDOMETRIOSIS LYSIS OF ADHESIONS;  Surgeon: Yalcinkaya, Tamer, MD;  Location: [facility];  Service: Gynecology;  Laterality: Left;   TONSILLECTOMY  age 73    Family History: family history includes Diabetes in her maternal grandmother; Thyroid disease in her mother. Social History:  reports that she quit smoking about 9 years ago. Her smoking use included cigarettes. She started smoking about 13 years ago.  She has a 12 pack-year smoking history. She has never used smokeless tobacco. She reports that she does not drink alcohol and does not use drugs.     Maternal Diabetes: Yes:  Diabetes Type:  Insulin/Medication controlled Genetic Screening: Abnormal:  Results: Other:trisomy 16 mosaicism on placenta Maternal Ultrasounds/Referrals: Normal Fetal Ultrasounds or other Referrals:  Referred to Materal Fetal Medicine  Maternal Substance Abuse:  No Significant Maternal Medications:  None Significant Maternal Lab Results:  Other: GBS unknown Number of Prenatal Visits:greater than 3 verified prenatal visits Maternal Vaccinations:TDap and Flu Other Comments:  None  Review of Systems  Constitutional:  Negative for activity change, fatigue and fever.  Eyes:  Negative for visual disturbance.  Respiratory:  Negative for chest tightness and shortness of breath.   Cardiovascular:  Negative for chest pain, palpitations and leg swelling.  Gastrointestinal:  Negative for abdominal pain, diarrhea and nausea.  Genitourinary:  Negative for flank pain, menstrual problem, pelvic pain and vaginal bleeding.  Musculoskeletal:  Negative for back pain.  Neurological:  Negative for light-headedness and headaches.  Psychiatric/Behavioral:  Negative for behavioral problems. The patient is not nervous/anxious.    Maternal Medical History:  Reason for admission: Rupture of membranes.  Nausea.  Contractions: Frequency: rare.  Perceived severity is mild.   Fetal activity: Perceived fetal activity is normal.   Prenatal complications: IUGR.   Prenatal Complications - Diabetes: gestational. Diabetes is managed by oral agent (monotherapy).       Blood pressure (!) 121/54, pulse 96, temperature 97.7 F (36.5 C), temperature source Oral, resp. rate 12, height 5' 3 (1.6 m), weight 107.6 kg, last menstrual period 11/11/2023. Maternal Exam:  Uterine Assessment: Contraction strength is mild.  Contraction frequency is  rare.  Abdomen: Patient reports no abdominal tenderness. Surgical scars: low transverse.   Estimated fetal weight is FGR.   Introitus: Amniotic fluid character: clear. Pelvis: of concern for delivery.   Cervix: Cervix evaluated by sterile speculum exam.     Fetal Exam Fetal Monitor Review: Baseline rate: 140.  Variability: moderate (6-25 bpm).   Pattern: accelerations present and no decelerations.   Fetal State Assessment: Category I - tracings are normal.   Physical Exam Vitals and nursing note reviewed.  Constitutional:      Appearance: Normal appearance. She is normal weight.  Cardiovascular:     Pulses: Normal pulses.  Pulmonary:     Effort: Pulmonary effort is normal.  Musculoskeletal:        General: Normal range of motion.     Cervical back: Normal range of motion.  Skin:    General: Skin is warm and dry.     Capillary Refill: Capillary refill takes 2 to 3 seconds.  Neurological:     General: No focal deficit present.     Mental Status: She is alert and oriented to person, place, and time. Mental status is at baseline.  Psychiatric:        Mood and Affect: Mood normal.        Behavior: Behavior normal.        Thought Content: Thought content normal.        Judgment: Judgment normal.     Prenatal labs: ABO, Rh: --/--/O NEG (08/03 0900) Antibody: NEG (08/03 0900) Rubella:   RPR:    HBsAg:    HIV:    GBS:     Assessment/Plan: 33yo G 2P1001 female at 60 0/7wks admitted for PPROM - Admit  - MgSo4 for CP prophylaxis started - BMZ for lung development  - Antibiotics started - Consults to MFM and NEo and US  ordered - Fetal monitoring q shift - Get GBS  - CBG daily - fating and postprandial and 2.5mg  glyburide  daily - Plan for delivery via cesarean section at 34 weeks or before if indicated   Sohaib Vereen W Dartanyon Frankowski 06/29/2024, 12:50 AM

## 2024-06-29 NOTE — Progress Notes (Signed)
 Per MFM note, will DC magnesium  sulfate after 12 hrs. Still not endorsing cramping BP 133/70 (BP Location: Right Arm)   Pulse (!) 107   Temp (!) 97.5 F (36.4 C) (Oral)   Resp 16   Ht 5' 3 (1.6 m)   Wt 107.6 kg   LMP 11/11/2023   SpO2 98%   BMI 42.04 kg/m  Change to NST q-shift. Added to scheduled induction for 1/10 at 34 0/7 with Dr Juna.

## 2024-06-29 NOTE — Progress Notes (Signed)
 Antepartum Progress Note S: +FM, denies VB, contractions, continued clear fluid leakage. Denies fevers, temperatures. Explained purpose of BMTZ for premature infant and recommendation for 34wk delivery. Also reviewed TOLAC vs RLTCS, pt aware of 40% score and does still desire TOLAC as long as FHR is reassuring.   O: BP 132/69 (BP Location: Right Arm)   Pulse 89   Temp (!) 97.5 F (36.4 C) (Oral)   Resp 16   Ht 5' 3 (1.6 m)   Wt 107.6 kg   LMP 11/11/2023   SpO2 98%   BMI 42.04 kg/m  Gen: NAD CV: CTAB, RRR Abd: Palpable fundus, pfannenstiel well healed GU deferred MSK: SCDs in place  Labs: Oneg (Rhogam given outpatient), 11.7/34.5/280K, Cr 0.58, Ast/ALT 22/21 Fasting BG 183 (s/p BMTZ)  A/P: This is a 33yo G2P1001 @ 33 0/7 admitted HD#1 with confirmed PPROM. H/o csx x1, GDMA2 on glyburide , BMI 42, Rh neg, prior FGR, known placental mosaicism 1) IUP @ 33 0/7 - magnesium  sulfate for CP ppx, s/p BMTZ x1, MFM and NICU consults pending -GBS pending  2) GDMA2: continue home glyburide  2.5mg  every day 3) BMI 42: SCD's, baby ASA in  house 4) Resolved FGR: Last GS on 12/24: FHR 145, post plac, vtx, AFI 12cm, EFW 1609g/3lb9oz/14th%tile, normal dopplers 5) H/o csx x1 - VBAC consent form within chart, discussed FHR tracing and allowing for TOLAC vs RLTCS, patient understands both situations

## 2024-06-29 NOTE — MAU Note (Signed)

## 2024-06-29 NOTE — Plan of Care (Signed)

## 2024-06-30 ENCOUNTER — Other Ambulatory Visit: Payer: Self-pay

## 2024-06-30 LAB — GLUCOSE, CAPILLARY
Glucose-Capillary: 132 mg/dL — ABNORMAL HIGH (ref 70–99)
Glucose-Capillary: 160 mg/dL — ABNORMAL HIGH (ref 70–99)
Glucose-Capillary: 75 mg/dL (ref 70–99)
Glucose-Capillary: 89 mg/dL (ref 70–99)

## 2024-06-30 MED ORDER — LORATADINE 10 MG PO TABS
10.0000 mg | ORAL_TABLET | Freq: Every day | ORAL | Status: DC
  Administered 2024-06-30 – 2024-07-04 (×5): 10 mg via ORAL
  Filled 2024-06-30 (×6): qty 1

## 2024-06-30 MED ORDER — LACTATED RINGERS IV BOLUS
500.0000 mL | Freq: Once | INTRAVENOUS | Status: AC
  Administered 2024-06-30: 500 mL via INTRAVENOUS

## 2024-06-30 NOTE — Progress Notes (Signed)
 Antepartum Progress Note S: +FM, denies VB, contractions, continued clear fluid leakage. Denies fevers, temperatures. Touched on NICU expectations, lactation availability.   O: BP 114/61 (BP Location: Right Arm)   Pulse 86   Temp 98.3 F (36.8 C) (Oral)   Resp 19   Ht 5' 3 (1.6 m)   Wt 107.6 kg   LMP 11/11/2023   SpO2 97%   BMI 42.04 kg/m  Gen: NAD CV: CTAB, RRR Abd: Palpable fundus, pfannenstiel well healed GU deferred MSK: SCDs in place  Labs: Oneg (Rhogam given outpatient), 11.7/34.5/280K, Cr 0.58, Ast/ALT 22/21 BG: 183 (fasting) > 117 > 129 > 132 (fasting)  A/P: This is a 33yo G2P1001 @ 33 1/7 admitted HD#2 with confirmed PPROM. H/o csx x1, GDMA2 on glyburide , BMI 42, Rh neg, prior FGR, known placental mosaicism 1) IUP @ 33 1/7 - s/p magnesium  sulfate for 12hrs, s/p BMTZ x2, -S/p MFM consult -NICU consult today, team aware -GBS pending  2) GDMA2: continue home glyburide  2.5mg  every day -PP WNL however fastings elevated but improving, likely 2/2 BMTZ, continue to monitor 3) BMI 42: SCD's, baby ASA in  house 4) Resolved FGR: Last GS on 12/24: FHR 145, post plac, vtx, AFI 12cm, EFW 1609g/3lb9oz/14th%tile, normal dopplers 5) H/o csx x1 - VBAC consent form within chart, discussed FHR tracing and allowing for TOLAC vs RLTCS, patient understands both situations

## 2024-06-30 NOTE — Plan of Care (Signed)

## 2024-06-30 NOTE — Consult Note (Signed)
 Neonatology Consult  Note:  At the request of the patients obstetrician Dr. Delana I met with Angela Willis who is a 34 y.o. G66P1001 female who is admitted for PPROM.  Fetal growth restriction- noted at 23 weeks but resolved at 32 week. Per MFM delivery ok at 39 weeks Her cell free DNA test indicated a high risk for trisomy 16.  IUGR was noted earlier in her current pregnancy.   She has been followed by MFM at [facility] with serial growth ultrasounds.  She has declined an amniocentesis for definitive prenatal diagnosis of fetal aneuploidy. GDMA2- pt declined insulin. Treated with glyburide  2.5mg  daily. Also hx GDMA2 in previous pregnancy as well Previous cesarean section x 1; planned repeat Desires sterilization  Rh neg - received rhogam at 28 weeks  GBS unknown - not done yet given GA  Currently s/p magnesium  sulfate for 12hrs, s/p BMTZ x2.  GBS pending.  We reviewed initial delivery room management, including CPAP, [place], and low but certainly possible need for intubation for surfactant administration.  We discussed feeding immaturity and need for full po intake with multiple days of good weight gain and no apnea or bradycardia before discharge.  We reviewed increased risk of jaundice, infection, and temperature instability.   Discussed likely length of stay.  Thank you for allowing us  to participate in her care.  Please call with questions.  Total clinician time devoted to the patient on the day of this visit excluding time spent on other separately reported services was 45 minutes.  Morene Kipper, DO  Neonatologist

## 2024-06-30 NOTE — Progress Notes (Addendum)
 Notified by RN that patient noticed likely mucus plug after voiding this evening. Denies frank VB, cramping, endorses good FM. Denies increase in pelvic pressure. Advised to place on NST at start of shift to eval for TOCO activity. As of this note, no activity noted on TOCO. NO decels noted, +accel, small variable x1. Overall reassuring BP 135/81 (BP Location: Right Arm)   Pulse 100   Temp 98 F (36.7 C) (Oral)   Resp 18   Ht 5' 3 (1.6 m)   Wt 107.6 kg   LMP 11/11/2023   SpO2 98%   BMI 42.04 kg/m

## 2024-07-01 LAB — GLUCOSE, CAPILLARY
Glucose-Capillary: 86 mg/dL (ref 70–99)
Glucose-Capillary: 104 mg/dL — ABNORMAL HIGH (ref 70–99)
Glucose-Capillary: 90 mg/dL (ref 70–99)

## 2024-07-01 NOTE — Progress Notes (Signed)
 HD#3 Antepartum Progress Note S: Angela Willis is feeling well today. +FM, denies VB, contractions, continued clear fluid leakage (now with pink tinge). Denies fever and chills. 36 year old daughter visited outside her window yesterday, both in good spirits.    O:     07/01/2024   12:08 PM 07/01/2024    8:14 AM 07/01/2024   12:18 AM  Vitals with BMI  Systolic 124 127 871  Diastolic 67 77 66  Pulse 75 68 91     Gen: NAD CV: normal WOB on room air, well perfused Abd: gravid, non-tender GU deferred MSK: normal ROM, no e/o DVT  NST: Reactive, reassuring NST with rare shallow variable. Toco: quiet  CBG: 132 (fasting) > 160 > 89 > 75 >86 (fasting)>104  A/P: Angela Willis is a 33yo G2P1001 @ 33 2/7 admitted HD#3 with confirmed PPROM.  1) PPROM/prematurity: s/p magnesium  sulfate for 12hrs, s/p BMTZ x2, currently on latency abx, GBS pending. S/p MFM and NICU consults. Vertex presentation as of 1/3. mIOL scheduled for [redacted]w[redacted]d, on 1/10.  2) GDMA2: continue home glyburide  2.5mg  every day. CBGs elevated in s/o BMZ but improving. Will not increase medication at this time, monitor. 3) BMI 42: SCD's, ambulation, baby ASA in house for VTE ppx. Ok to walk on the unit if asymptomatic of labor.  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 Normal fetal anatomy and normal fetal ECHO. Baby to be tested after delivery. 6) H/o csx x1: TOLAC consent form within chart, low likelihood of success discussed outpatient, as well as plan to move forward with rCS if IOL were needed, however given much smaller baby, reasonable to TOLAC per patient's strong desire. 7) Rh neg: s/p Rhogam on 05/27/24  Dispo: Continue inpatient management until delivery at [redacted]w[redacted]d unless indicated sooner.

## 2024-07-02 ENCOUNTER — Telehealth (HOSPITAL_COMMUNITY): Payer: Self-pay | Admitting: *Deleted

## 2024-07-02 LAB — CBC
HCT: 33.2 % — ABNORMAL LOW (ref 36.0–46.0)
Hemoglobin: 11 g/dL — ABNORMAL LOW (ref 12.0–15.0)
MCH: 29.2 pg (ref 26.0–34.0)
MCHC: 33.1 g/dL (ref 30.0–36.0)
MCV: 88.1 fL (ref 80.0–100.0)
Platelets: 328 K/uL (ref 150–400)
RBC: 3.77 MIL/uL — ABNORMAL LOW (ref 3.87–5.11)
RDW: 14 % (ref 11.5–15.5)
WBC: 18.2 K/uL — ABNORMAL HIGH (ref 4.0–10.5)
nRBC: 1.2 % — ABNORMAL HIGH (ref 0.0–0.2)

## 2024-07-02 LAB — TYPE AND SCREEN
ABO/RH(D): O NEG
Antibody Screen: POSITIVE

## 2024-07-02 LAB — COMPREHENSIVE METABOLIC PANEL WITH GFR
ALT: 16 U/L (ref 0–44)
AST: 19 U/L (ref 15–41)
Albumin: 3.3 g/dL — ABNORMAL LOW (ref 3.5–5.0)
Alkaline Phosphatase: 87 U/L (ref 38–126)
Anion gap: 10 (ref 5–15)
BUN: 12 mg/dL (ref 6–20)
CO2: 24 mmol/L (ref 22–32)
Calcium: 8.6 mg/dL — ABNORMAL LOW (ref 8.9–10.3)
Chloride: 101 mmol/L (ref 98–111)
Creatinine, Ser: 0.72 mg/dL (ref 0.44–1.00)
GFR, Estimated: >60 mL/min
Glucose, Bld: 84 mg/dL (ref 70–99)
Potassium: 4.2 mmol/L (ref 3.5–5.1)
Sodium: 135 mmol/L (ref 135–145)
Total Bilirubin: 0.2 mg/dL (ref 0.0–1.2)
Total Protein: 5.7 g/dL — ABNORMAL LOW (ref 6.5–8.1)

## 2024-07-02 LAB — GLUCOSE, CAPILLARY
Glucose-Capillary: 110 mg/dL — ABNORMAL HIGH (ref 70–99)
Glucose-Capillary: 130 mg/dL — ABNORMAL HIGH (ref 70–99)
Glucose-Capillary: 78 mg/dL (ref 70–99)
Glucose-Capillary: 84 mg/dL (ref 70–99)
Glucose-Capillary: 85 mg/dL (ref 70–99)

## 2024-07-02 LAB — CULTURE, BETA STREP (GROUP B ONLY)

## 2024-07-02 NOTE — Progress Notes (Signed)
 HD#4 Antepartum Progress Note S: Angela Willis is feeling well today. +FM, denies VB, continued clear fluid leakage (now with pink tinge). Has experienced some mild contractions overnight, but < 1 per hour. Denies fever and chills.    O:     07/02/2024   12:29 AM 07/01/2024    7:40 PM 07/01/2024    5:14 PM  Vitals with BMI  Systolic 125 126 886  Diastolic 84 66 63  Pulse 79 80 79     Gen: NAD CV: normal WOB on room air, well perfused Abd: gravid, non-tender GU deferred MSK: normal ROM, no e/o DVT  NST: baseline 140s, moderate variability, + accelerations, no decelerations Toco: quiet  CBG: 85 fasting this AM.  Angela Willis PP--104/90/130  A/P: [name] is a 33yo G2P1001 @ [redacted]w[redacted]d admitted HD#4 with confirmed PPROM.  1) PPROM/prematurity: s/p magnesium  sulfate for 12hrs, s/p BMTZ x2, currently on latency abx, GBS pending. S/p MFM and NICU consults. Vertex presentation as of 1/3. mIOL scheduled for [redacted]w[redacted]d, on 1/10.  2) GDMA2: continue home glyburide  2.5mg  every day. CBGs elevated in s/o BMZ but improving. Will not increase medication at this time, monitor. 3) BMI 42: SCD's, ambulation, baby ASA in house for VTE ppx. Ok to walk on the unit if asymptomatic of labor.  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 Normal fetal anatomy and normal fetal ECHO. Baby to be tested after delivery. 6) H/o csx x1: TOLAC consent form within chart, low likelihood of success discussed outpatient, as well as plan to move forward with rCS if IOL were needed, however given much smaller baby, reasonable to TOLAC per patient's strong desire. 7) Contraception: was previously considering permanent sterilization.  Tubal papers not signed.  Discussed in detail today if she would desire salpingectomy if unscheduled cesarean section required.  Given prematurity, she does not  feel that she would desire permanent sterilization at this time.  Reviewed interval salpingectomy, vasectomy, LARC options.  She will continue to think about it and if changes her mind, would need to sign medicaid tubal papers in next 24 hours 8) Rh neg: s/p Rhogam on 05/27/24  Dispo: Continue inpatient management until delivery at [redacted]w[redacted]d unless indicated sooner.

## 2024-07-02 NOTE — Telephone Encounter (Signed)
 Preadmission screen

## 2024-07-03 ENCOUNTER — Encounter: Payer: Self-pay | Admitting: Nurse Practitioner

## 2024-07-03 DIAGNOSIS — O42919 Preterm premature rupture of membranes, unspecified as to length of time between rupture and onset of labor, unspecified trimester: Principal | ICD-10-CM | POA: Diagnosis present

## 2024-07-03 LAB — GLUCOSE, CAPILLARY
Glucose-Capillary: 70 mg/dL (ref 70–99)
Glucose-Capillary: 109 mg/dL — ABNORMAL HIGH (ref 70–99)
Glucose-Capillary: 78 mg/dL (ref 70–99)

## 2024-07-03 NOTE — Progress Notes (Signed)
 Patient ID: Angela Willis, female   DOB: 11-Jul-1990, 34 y.o.   MRN: [id number]    Subjective:    CTX q15-30 minutes overnight, now resolved. Pink-tinged, non-malodorous discharge with mixed mucous. Normal FM. Denies F/C   Objective:  Vitals:   07/03/24 0004 07/03/24 0334 07/03/24 0802 07/03/24 1208  BP: 137/78 131/85 (!) 141/82 131/74  Pulse: 82 71 75 80  Resp: 16 18 17 18   Temp: 98 F (36.7 C) 98 F (36.7 C) 97.6 F (36.4 C) 98 F (36.7 C)  TempSrc: Oral Oral Oral Oral  SpO2: 98% 97% 98% 99%  Weight:      Height:           Gen: NAD CV: normal WOB on room air, well perfused Abd: soft, gravid, non-tender Ext: no significant edema   NST: baseline 140s, moderate variability, + accelerations, no decelerations Toco: quiet     A/P:  33yo G2P1001 @ [redacted]w[redacted]d admitted HD#5 with  PPROM.  1) PPROM/prematurity: s/p magnesium  sulfate for 12hrs, s/p BMTZ x2, currently on latency abx, GBS positive. S/p MFM and NICU consults. Vertex presentation as of 1/3. mIOL scheduled for [redacted]w[redacted]d, on 1/10.  2) GDMA2: continue home glyburide  2.5mg  every day. CBGs well-controlled 3) BMI 42: SCD's, ambulation, baby ASA in house for VTE ppx. Ok to walk on the unit if asymptomatic of labor.  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 Normal fetal anatomy and normal fetal ECHO. Baby to be tested after delivery. 6) H/o csx x1: TOLAC consent form within chart, low likelihood of success discussed outpatient, as well as plan to move forward with rCS if IOL were needed, however given much smaller baby, reasonable to TOLAC per patient's strong desire. 7) Contraception: was previously considering permanent sterilization.  Tubal papers not signed.  We revisited this today and patient states she does not desire permanent sterilization at this time given prematurity. She  understands that if she were to change her mind in the next days before IOL and she were to have an unscheduled c-section, she would not be able to have procedure given no Medicaid consent. 8) Rh neg: s/p Rhogam on 05/27/24   Dispo: Continue inpatient management until delivery at [redacted]w[redacted]d unless indicated sooner.

## 2024-07-03 NOTE — Plan of Care (Signed)
   Problem: Education: Goal: Knowledge of the prescribed therapeutic regimen will improve Outcome: Progressing

## 2024-07-04 LAB — GLUCOSE, CAPILLARY
Glucose-Capillary: 105 mg/dL — ABNORMAL HIGH (ref 70–99)
Glucose-Capillary: 117 mg/dL — ABNORMAL HIGH (ref 70–99)
Glucose-Capillary: 80 mg/dL (ref 70–99)
Glucose-Capillary: 91 mg/dL (ref 70–99)
Glucose-Capillary: 91 mg/dL (ref 70–99)

## 2024-07-04 NOTE — Progress Notes (Signed)
 Patient ID: Angela Willis, female   DOB: 1991/01/15, 34 y.o.   MRN: [id number]    Subjective:   Irregular contractions overnight. Pink-tinged, non-malodorous discharge with mixed mucous. Normal FM. Denies fever/chills   Objective:  Vitals:   07/03/24 1542 07/03/24 1931 07/03/24 2355 07/04/24 0752  BP: 136/82 132/73 127/70 129/77  Pulse: 80 84 78 71  Resp: 17 18 18 17   Temp: 98.4 F (36.9 C) 98.3 F (36.8 C) 98.3 F (36.8 C) 98 F (36.7 C)  TempSrc: Oral Oral Oral Oral  SpO2: 98%  98% 97%  Weight:      Height:           Gen: NAD CV: normal WOB on room air, well perfused Abd: soft, gravid, non-tender Ext: no significant edema   NST pending this AM    A/P:  33yo G2P1001 @ [redacted]w[redacted]d admitted HD#6 with  PPROM.  1) PPROM/prematurity: s/p magnesium  sulfate for 12hrs, s/p BMTZ x2, currently on latency abx, GBS positive. S/p MFM and NICU consults. Vertex presentation as of 1/3. mIOL scheduled for [redacted]w[redacted]d, on 1/10.  2) GDMA2: continue home glyburide  2.5mg  every day. CBGs well-controlled 3) BMI 42: SCD's, ambulation, baby ASA in house for VTE ppx. Ok to walk on the unit if asymptomatic of labor.  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 Normal fetal anatomy and normal fetal ECHO. Baby to be tested after delivery. 6) H/o csx x1: TOLAC consent form within chart, low likelihood of success discussed outpatient, as well as plan to move forward with rCS if IOL were needed, however given much smaller baby, reasonable to TOLAC per patient's strong desire. 7) Contraception: was previously considering permanent sterilization.  Tubal papers not signed.  We revisited this today and patient states she does not desire permanent sterilization at this time given prematurity. She understands that if she were to change her mind in the next days before IOL and she  were to have an unscheduled c-section, she would not be able to have procedure given no Medicaid consent. 8) Rh neg: s/p Rhogam on 05/27/24   Dispo: Continue inpatient management until delivery at [redacted]w[redacted]d unless indicated sooner.

## 2024-07-05 ENCOUNTER — Inpatient Hospital Stay (HOSPITAL_COMMUNITY)

## 2024-07-05 ENCOUNTER — Inpatient Hospital Stay (HOSPITAL_COMMUNITY): Admitting: Anesthesiology

## 2024-07-05 ENCOUNTER — Encounter (HOSPITAL_COMMUNITY): Payer: Self-pay | Admitting: Student

## 2024-07-05 ENCOUNTER — Encounter (HOSPITAL_COMMUNITY): Admission: AD | Disposition: A | Payer: Self-pay | Source: Home / Self Care | Attending: Obstetrics and Gynecology

## 2024-07-05 DIAGNOSIS — Z98891 History of uterine scar from previous surgery: Secondary | ICD-10-CM

## 2024-07-05 DIAGNOSIS — O99892 Other specified diseases and conditions complicating childbirth: Secondary | ICD-10-CM | POA: Diagnosis not present

## 2024-07-05 DIAGNOSIS — R001 Bradycardia, unspecified: Secondary | ICD-10-CM | POA: Diagnosis not present

## 2024-07-05 DIAGNOSIS — O42113 Preterm premature rupture of membranes, onset of labor more than 24 hours following rupture, third trimester: Secondary | ICD-10-CM | POA: Diagnosis not present

## 2024-07-05 DIAGNOSIS — O368330 Maternal care for abnormalities of the fetal heart rate or rhythm, third trimester, not applicable or unspecified: Secondary | ICD-10-CM | POA: Diagnosis not present

## 2024-07-05 DIAGNOSIS — Z3A33 33 weeks gestation of pregnancy: Secondary | ICD-10-CM | POA: Diagnosis not present

## 2024-07-05 LAB — CBC
HCT: 38.5 % (ref 36.0–46.0)
HCT: 32.3 % — ABNORMAL LOW (ref 36.0–46.0)
Hemoglobin: 12.8 g/dL (ref 12.0–15.0)
Hemoglobin: 11 g/dL — ABNORMAL LOW (ref 12.0–15.0)
MCH: 28.8 pg (ref 26.0–34.0)
MCH: 29.2 pg (ref 26.0–34.0)
MCHC: 33.2 g/dL (ref 30.0–36.0)
MCHC: 34.1 g/dL (ref 30.0–36.0)
MCV: 86.5 fL (ref 80.0–100.0)
MCV: 85.7 fL (ref 80.0–100.0)
Platelets: 317 K/uL (ref 150–400)
Platelets: 294 K/uL (ref 150–400)
RBC: 4.45 MIL/uL (ref 3.87–5.11)
RBC: 3.77 MIL/uL — ABNORMAL LOW (ref 3.87–5.11)
RDW: 14.1 % (ref 11.5–15.5)
RDW: 14.2 % (ref 11.5–15.5)
WBC: 19 K/uL — ABNORMAL HIGH (ref 4.0–10.5)
WBC: 22.1 K/uL — ABNORMAL HIGH (ref 4.0–10.5)
nRBC: 0.2 % (ref 0.0–0.2)
nRBC: 0 % (ref 0.0–0.2)

## 2024-07-05 LAB — CREATININE, SERUM
Creatinine, Ser: 0.63 mg/dL (ref 0.44–1.00)
GFR, Estimated: >60 mL/min

## 2024-07-05 LAB — GLUCOSE, CAPILLARY
Glucose-Capillary: 87 mg/dL (ref 70–99)
Glucose-Capillary: 70 mg/dL (ref 70–99)
Glucose-Capillary: 96 mg/dL (ref 70–99)
Glucose-Capillary: 109 mg/dL — ABNORMAL HIGH (ref 70–99)
Glucose-Capillary: 91 mg/dL (ref 70–99)

## 2024-07-05 LAB — TYPE AND SCREEN
ABO/RH(D): O NEG
Antibody Screen: POSITIVE

## 2024-07-05 LAB — OB RESULTS CONSOLE GBS: GBS: POSITIVE

## 2024-07-05 MED ORDER — SOD CITRATE-CITRIC ACID 500-334 MG/5ML PO SOLN
ORAL | Status: AC
  Filled 2024-07-05: qty 30

## 2024-07-05 MED ORDER — ACETAMINOPHEN 500 MG PO TABS: 1000.0000 mg | ORAL_TABLET | Freq: Four times a day (QID) | ORAL | Status: DC

## 2024-07-05 MED ORDER — DIPHENHYDRAMINE HCL 25 MG PO CAPS: 25.0000 mg | ORAL_CAPSULE | ORAL | Status: DC | PRN

## 2024-07-05 MED ORDER — LIDOCAINE-EPINEPHRINE (PF) 2 %-1:200000 IJ SOLN
INTRAMUSCULAR | Status: DC | PRN
  Administered 2024-07-05: 15 mL via EPIDURAL
  Administered 2024-07-05: 3 mL via EPIDURAL

## 2024-07-05 MED ORDER — LIDOCAINE HCL (PF) 1 % IJ SOLN
INTRAMUSCULAR | Status: DC | PRN
  Administered 2024-07-05 (×2): 4 mL via EPIDURAL

## 2024-07-05 MED ORDER — SODIUM CHLORIDE 0.9 % IV SOLN
5.0000 10*6.[IU] | Freq: Once | INTRAVENOUS | Status: AC
  Administered 2024-07-05: 5 10*6.[IU] via INTRAVENOUS
  Filled 2024-07-05: qty 5

## 2024-07-05 MED ORDER — SIMETHICONE 80 MG PO CHEW
80.0000 mg | CHEWABLE_TABLET | Freq: Three times a day (TID) | ORAL | Status: DC
  Administered 2024-07-06 (×2): 80 mg via ORAL
  Filled 2024-07-05 (×2): qty 1

## 2024-07-05 MED ORDER — OXYTOCIN-SODIUM CHLORIDE 30-0.9 UT/500ML-% IV SOLN
2.5000 [IU]/h | INTRAVENOUS | Status: DC
  Filled 2024-07-05: qty 500

## 2024-07-05 MED ORDER — LACTATED RINGERS IV SOLN: 500.0000 mL | INTRAVENOUS | Status: DC | PRN

## 2024-07-05 MED ORDER — PENICILLIN G POT IN DEXTROSE 60000 UNIT/ML IV SOLN
3.0000 10*6.[IU] | INTRAVENOUS | Status: DC
  Administered 2024-07-05 (×2): 3 10*6.[IU] via INTRAVENOUS
  Filled 2024-07-05 (×2): qty 50

## 2024-07-05 MED ORDER — KETOROLAC TROMETHAMINE 30 MG/ML IJ SOLN: 30.0000 mg | Freq: Four times a day (QID) | INTRAMUSCULAR | Status: DC | PRN

## 2024-07-05 MED ORDER — MORPHINE SULFATE (PF) 0.5 MG/ML IJ SOLN
INTRAMUSCULAR | Status: AC
  Filled 2024-07-05: qty 10

## 2024-07-05 MED ORDER — LACTATED RINGERS IV SOLN
500.0000 mL | Freq: Once | INTRAVENOUS | Status: AC
  Administered 2024-07-05: 500 mL via INTRAVENOUS

## 2024-07-05 MED ORDER — OXYCODONE HCL 5 MG PO TABS: 5.0000 mg | ORAL_TABLET | ORAL | Status: DC | PRN

## 2024-07-05 MED ORDER — ONDANSETRON HCL 4 MG/2ML IJ SOLN
4.0000 mg | Freq: Three times a day (TID) | INTRAMUSCULAR | Status: DC | PRN
  Administered 2024-07-06: 4 mg via INTRAVENOUS
  Filled 2024-07-05: qty 2

## 2024-07-05 MED ORDER — MORPHINE SULFATE (PF) 0.5 MG/ML IJ SOLN
INTRAMUSCULAR | Status: DC | PRN
  Administered 2024-07-05: 3 mg via EPIDURAL

## 2024-07-05 MED ORDER — PHENYLEPHRINE 80 MCG/ML (10ML) SYRINGE FOR IV PUSH (FOR BLOOD PRESSURE SUPPORT): 80.0000 ug | PREFILLED_SYRINGE | INTRAVENOUS | Status: DC | PRN

## 2024-07-05 MED ORDER — PHENYLEPHRINE HCL (PRESSORS) 10 MG/ML IV SOLN
INTRAVENOUS | Status: DC | PRN
  Administered 2024-07-05 (×2): 80 ug via INTRAVENOUS
  Administered 2024-07-05: 160 ug via INTRAVENOUS

## 2024-07-05 MED ORDER — DIPHENHYDRAMINE HCL 25 MG PO CAPS: 25.0000 mg | ORAL_CAPSULE | Freq: Four times a day (QID) | ORAL | Status: DC | PRN

## 2024-07-05 MED ORDER — ZOLPIDEM TARTRATE 5 MG PO TABS: 5.0000 mg | ORAL_TABLET | Freq: Every evening | ORAL | Status: DC | PRN

## 2024-07-05 MED ORDER — METHYLERGONOVINE MALEATE 0.2 MG PO TABS: 0.2000 mg | ORAL_TABLET | ORAL | Status: DC | PRN

## 2024-07-05 MED ORDER — ACETAMINOPHEN 10 MG/ML IV SOLN
INTRAVENOUS | Status: DC | PRN
  Administered 2024-07-05: 1000 mg via INTRAVENOUS

## 2024-07-05 MED ORDER — EPHEDRINE 5 MG/ML INJ
10.0000 mg | INTRAVENOUS | Status: DC | PRN
  Filled 2024-07-05: qty 5

## 2024-07-05 MED ORDER — OXYTOCIN-SODIUM CHLORIDE 30-0.9 UT/500ML-% IV SOLN: 2.5000 [IU]/h | INTRAVENOUS | Status: AC

## 2024-07-05 MED ORDER — SODIUM CHLORIDE 0.9 % IV SOLN
INTRAVENOUS | Status: DC | PRN
  Administered 2024-07-05: 500 mg via INTRAVENOUS

## 2024-07-05 MED ORDER — ONDANSETRON HCL 4 MG/2ML IJ SOLN
INTRAMUSCULAR | Status: DC | PRN
  Administered 2024-07-05: 4 mg via INTRAVENOUS

## 2024-07-05 MED ORDER — KETOROLAC TROMETHAMINE 30 MG/ML IJ SOLN
30.0000 mg | Freq: Four times a day (QID) | INTRAMUSCULAR | Status: AC
  Administered 2024-07-05 – 2024-07-06 (×4): 30 mg via INTRAVENOUS
  Filled 2024-07-05 (×4): qty 1

## 2024-07-05 MED ORDER — ONDANSETRON HCL 4 MG/2ML IJ SOLN: 4.0000 mg | Freq: Four times a day (QID) | INTRAMUSCULAR | Status: DC | PRN

## 2024-07-05 MED ORDER — SODIUM CHLORIDE 0.9% FLUSH: 3.0000 mL | INTRAVENOUS | Status: DC | PRN

## 2024-07-05 MED ORDER — CEFAZOLIN SODIUM-DEXTROSE 2-4 GM/100ML-% IV SOLN
2.0000 g | Freq: Three times a day (TID) | INTRAVENOUS | Status: AC
  Administered 2024-07-06 (×3): 2 g via INTRAVENOUS
  Filled 2024-07-05 (×4): qty 100

## 2024-07-05 MED ORDER — STERILE WATER FOR IRRIGATION IR SOLN
Status: DC | PRN
  Administered 2024-07-05: 1

## 2024-07-05 MED ORDER — PHENYLEPHRINE HCL-NACL 20-0.9 MG/250ML-% IV SOLN
INTRAVENOUS | Status: DC | PRN
  Administered 2024-07-05: 60 ug/min via INTRAVENOUS

## 2024-07-05 MED ORDER — DIPHENHYDRAMINE HCL 50 MG/ML IJ SOLN: 12.5000 mg | INTRAMUSCULAR | Status: DC | PRN

## 2024-07-05 MED ORDER — CEFAZOLIN SODIUM-DEXTROSE 2-3 GM-%(50ML) IV SOLR
INTRAVENOUS | Status: DC | PRN
  Administered 2024-07-05: 2 g via INTRAVENOUS

## 2024-07-05 MED ORDER — IBUPROFEN 600 MG PO TABS: 600.0000 mg | ORAL_TABLET | Freq: Four times a day (QID) | ORAL | Status: DC

## 2024-07-05 MED ORDER — LACTATED RINGERS IV SOLN: INTRAVENOUS | Status: DC

## 2024-07-05 MED ORDER — FENTANYL CITRATE (PF) 100 MCG/2ML IJ SOLN
INTRAMUSCULAR | Status: AC
  Filled 2024-07-05: qty 2

## 2024-07-05 MED ORDER — SIMETHICONE 80 MG PO CHEW: 80.0000 mg | CHEWABLE_TABLET | ORAL | Status: DC | PRN

## 2024-07-05 MED ORDER — FENTANYL-BUPIVACAINE-NACL 0.5-0.125-0.9 MG/250ML-% EP SOLN
12.0000 mL/h | EPIDURAL | Status: DC | PRN
  Administered 2024-07-05: 12 mL/h via EPIDURAL
  Filled 2024-07-05: qty 250

## 2024-07-05 MED ORDER — LIDOCAINE HCL (PF) 1 % IJ SOLN: 30.0000 mL | INTRAMUSCULAR | Status: DC | PRN

## 2024-07-05 MED ORDER — SENNOSIDES-DOCUSATE SODIUM 8.6-50 MG PO TABS
2.0000 | ORAL_TABLET | Freq: Every day | ORAL | Status: DC
  Administered 2024-07-06: 2 via ORAL
  Filled 2024-07-05: qty 2

## 2024-07-05 MED ORDER — OXYCODONE-ACETAMINOPHEN 5-325 MG PO TABS: 1.0000 | ORAL_TABLET | ORAL | Status: DC | PRN

## 2024-07-05 MED ORDER — FENTANYL CITRATE (PF) 100 MCG/2ML IJ SOLN
INTRAMUSCULAR | Status: DC | PRN
  Administered 2024-07-05: 100 ug via EPIDURAL

## 2024-07-05 MED ORDER — OXYTOCIN-SODIUM CHLORIDE 30-0.9 UT/500ML-% IV SOLN
1.0000 m[IU]/min | INTRAVENOUS | Status: DC
  Administered 2024-07-05: 30 [IU] via INTRAVENOUS

## 2024-07-05 MED ORDER — ENOXAPARIN SODIUM 60 MG/0.6ML IJ SOSY
50.0000 mg | PREFILLED_SYRINGE | INTRAMUSCULAR | Status: DC
  Administered 2024-07-06: 50 mg via SUBCUTANEOUS
  Filled 2024-07-05: qty 0.6

## 2024-07-05 MED ORDER — TRANEXAMIC ACID-NACL 1000-0.7 MG/100ML-% IV SOLN
INTRAVENOUS | Status: DC | PRN
  Administered 2024-07-05: 1000 mg via INTRAVENOUS

## 2024-07-05 MED ORDER — DIBUCAINE (PERIANAL) 1 % EX OINT: 1.0000 | TOPICAL_OINTMENT | CUTANEOUS | Status: DC | PRN

## 2024-07-05 MED ORDER — TERBUTALINE SULFATE 1 MG/ML IJ SOLN
0.2500 mg | Freq: Once | INTRAMUSCULAR | Status: DC | PRN
  Filled 2024-07-05: qty 1

## 2024-07-05 MED ORDER — NALOXONE HCL 4 MG/10ML IJ SOLN: 1.0000 ug/kg/h | INTRAVENOUS | Status: DC | PRN

## 2024-07-05 MED ORDER — EPHEDRINE 5 MG/ML INJ: 10.0000 mg | INTRAVENOUS | Status: DC | PRN

## 2024-07-05 MED ORDER — OXYTOCIN BOLUS FROM INFUSION: 333.0000 mL | Freq: Once | INTRAVENOUS | Status: DC

## 2024-07-05 MED ORDER — ACETAMINOPHEN 10 MG/ML IV SOLN
INTRAVENOUS | Status: AC
  Filled 2024-07-05: qty 100

## 2024-07-05 MED ORDER — METHYLERGONOVINE MALEATE 0.2 MG/ML IJ SOLN: 0.2000 mg | INTRAMUSCULAR | Status: DC | PRN

## 2024-07-05 MED ORDER — LACTATED RINGERS IV BOLUS
1000.0000 mL | Freq: Once | INTRAVENOUS | Status: AC
  Administered 2024-07-05: 1000 mL via INTRAVENOUS

## 2024-07-05 MED ORDER — MENTHOL 3 MG MT LOZG: 1.0000 | LOZENGE | OROMUCOSAL | Status: DC | PRN

## 2024-07-05 MED ORDER — METOCLOPRAMIDE HCL 5 MG/ML IJ SOLN
INTRAMUSCULAR | Status: DC | PRN
  Administered 2024-07-05: 5 mg via INTRAVENOUS

## 2024-07-05 MED ORDER — NALOXONE HCL 0.4 MG/ML IJ SOLN: 0.4000 mg | INTRAMUSCULAR | Status: DC | PRN

## 2024-07-05 MED ORDER — WITCH HAZEL-GLYCERIN EX PADS: 1.0000 | MEDICATED_PAD | CUTANEOUS | Status: DC | PRN

## 2024-07-05 MED ORDER — PRENATAL MULTIVITAMIN CH
1.0000 | ORAL_TABLET | Freq: Every day | ORAL | Status: DC
  Administered 2024-07-06: 1 via ORAL
  Filled 2024-07-05: qty 1

## 2024-07-05 MED ORDER — PHENYLEPHRINE 80 MCG/ML (10ML) SYRINGE FOR IV PUSH (FOR BLOOD PRESSURE SUPPORT)
80.0000 ug | PREFILLED_SYRINGE | INTRAVENOUS | Status: DC | PRN
  Filled 2024-07-05: qty 10

## 2024-07-05 MED ORDER — ACETAMINOPHEN 500 MG PO TABS
1000.0000 mg | ORAL_TABLET | Freq: Four times a day (QID) | ORAL | Status: DC
  Administered 2024-07-06 (×3): 1000 mg via ORAL
  Filled 2024-07-05 (×3): qty 2

## 2024-07-05 MED ORDER — FENTANYL CITRATE (PF) 100 MCG/2ML IJ SOLN: 50.0000 ug | INTRAMUSCULAR | Status: DC | PRN

## 2024-07-05 MED ORDER — ACETAMINOPHEN 325 MG PO TABS: 650.0000 mg | ORAL_TABLET | ORAL | Status: DC | PRN

## 2024-07-05 MED ORDER — COCONUT OIL OIL: 1.0000 | TOPICAL_OIL | Status: DC | PRN

## 2024-07-05 MED ORDER — OXYCODONE-ACETAMINOPHEN 5-325 MG PO TABS: 2.0000 | ORAL_TABLET | ORAL | Status: DC | PRN

## 2024-07-05 NOTE — Progress Notes (Signed)
 Called by RN re: patient complaining of contractions q 3 minutes with pelvic pressure.   IV fluids started.  Pt seen and assessed at bedside, SVE 4/80/-1, vertex. Uncomfortable with contractions.  Fetal heart tracing cat I, toco shows q 3 min contractions  Will move to L&D for TOLAC, start penicillin  for GBS+, plan epidural  M. Brexley Cutshaw MD 07/05/2024 6:12 AM

## 2024-07-05 NOTE — Anesthesia Procedure Notes (Signed)
 Epidural Patient location during procedure: OB Start time: 07/05/2024 7:02 AM End time: 07/05/2024 7:07 AM  Staffing Anesthesiologist: Peggye Delon Brunswick, MD Performed: anesthesiologist   Preanesthetic Checklist Completed: patient identified, IV checked, risks and benefits discussed, monitors and equipment checked, pre-op evaluation and timeout performed  Epidural Patient position: sitting Prep: DuraPrep and site prepped and draped Patient monitoring: continuous pulse ox and blood pressure Approach: midline Location: L3-L4 Injection technique: LOR saline  Needle:  Needle type: Tuohy  Needle gauge: 17 G Needle length: 9 cm and 9 Needle insertion depth: 6 cm Catheter type: closed end flexible Catheter size: 19 Gauge Catheter at skin depth: 10 cm Test dose: negative  Assessment Events: blood not aspirated, no cerebrospinal fluid, injection not painful, no injection resistance, no paresthesia and negative IV test  Additional Notes The patient has requested an epidural for labor pain management. Risks and benefits including, but not limited to, infection, bleeding, local anesthetic toxicity, headache, hypotension, back pain, block failure, etc. were discussed with the patient. The patient expressed understanding and consented to the procedure. I confirmed that the patient has no bleeding disorders and is not taking blood thinners. I confirmed the patient's last platelet count with the nurse. A time-out was performed immediately prior to the procedure. Please see nursing documentation for vital signs. Sterile technique was used throughout the whole procedure. Once LOR achieved, the epidural catheter threaded easily without resistance. Aspiration of the catheter was negative for blood and CSF. The epidural was dosed slowly and an infusion was started.  1 attempt(s)Reason for block:procedure for pain

## 2024-07-05 NOTE — Transfer of Care (Signed)
 Immediate Anesthesia Transfer of Care Note  Patient: Angela Willis  Procedure(s) Performed: CESAREAN DELIVERY  Patient Location: PACU  Anesthesia Type:Epidural  Level of Consciousness: awake, alert , and oriented  Airway & Oxygen Therapy: Patient Spontanous Breathing  Post-op Assessment: Report given to RN and Post -op Vital signs reviewed and stable  Post vital signs: Reviewed and stable  Last Vitals:  Vitals Value Taken Time  BP 108/53 07/05/24 17:36  Temp    Pulse 84 07/05/24 17:39  Resp 13 07/05/24 17:39  SpO2 98 % 07/05/24 17:39  Vitals shown include unfiled device data.  Last Pain:  Vitals:   07/05/24 1401  TempSrc: Oral  PainSc:       Patients Stated Pain Goal: 3 (07/04/24 0810)  Complications: No notable events documented.

## 2024-07-05 NOTE — Anesthesia Preprocedure Evaluation (Signed)
 "                                  Anesthesia Evaluation  Patient identified by MRN, date of birth, ID band Patient awake    Reviewed: Allergy & Precautions, NPO status , Patient's Chart, lab work & pertinent test results  History of Anesthesia Complications Negative for: history of anesthetic complications  Airway Mallampati: III  TM Distance: >3 FB Neck ROM: Full    Dental  (+) Dental Advisory Given   Pulmonary neg shortness of breath, neg sleep apnea, neg COPD, neg recent URI, former smoker   Pulmonary exam normal breath sounds clear to auscultation       Cardiovascular negative cardio ROS  Rhythm:Regular Rate:Normal     Neuro/Psych  Headaches, neg Seizures PSYCHIATRIC DISORDERS (ADHD)         GI/Hepatic Neg liver ROS,GERD  ,,  Endo/Other  diabetes, Gestational  Class 3 obesity  Renal/GU negative Renal ROS     Musculoskeletal   Abdominal  (+) + obese  Peds  Hematology negative hematology ROS (+) Lab Results      Component                Value               Date                      WBC                      19.0 (H)            07/05/2024                HGB                      12.8                07/05/2024                HCT                      38.5                07/05/2024                MCV                      86.5                07/05/2024                PLT                      317                 07/05/2024              Anesthesia Other Findings Takes baby aspirin . H/o c-section x1.  Reproductive/Obstetrics (+) Pregnancy PCOS                              Anesthesia Physical Anesthesia Plan  ASA: 3  Anesthesia Plan: Epidural   Post-op Pain Management:    Induction:   PONV Risk Score and Plan:   Airway Management Planned: Natural  Airway  Additional Equipment:   Intra-op Plan:   Post-operative Plan:   Informed Consent: I have reviewed the patients History and Physical, chart, labs and discussed  the procedure including the risks, benefits and alternatives for the proposed anesthesia with the patient or authorized representative who has indicated his/her understanding and acceptance.       Plan Discussed with: Anesthesiologist  Anesthesia Plan Comments: (I have discussed risks of neuraxial anesthesia including but not limited to infection, bleeding, nerve injury, back pain, headache, seizures, and failure of block. Patient denies bleeding disorders and is not currently anticoagulated. Labs have been reviewed. Risks and benefits discussed. All patient's questions answered.  )         Anesthesia Quick Evaluation  "

## 2024-07-05 NOTE — Anesthesia Postprocedure Evaluation (Signed)
"   Anesthesia Post Note  Patient: Angela Willis  Procedure(s) Performed: CESAREAN DELIVERY     Patient location during evaluation: L&D Anesthesia Type: Epidural Level of consciousness: awake and alert Pain management: pain level controlled Vital Signs Assessment: post-procedure vital signs reviewed and stable Respiratory status: spontaneous breathing, nonlabored ventilation and respiratory function stable Cardiovascular status: stable Postop Assessment: no headache, no backache and epidural receding Anesthetic complications: no   No notable events documented.  Last Vitals:  Vitals:   07/05/24 1747 07/05/24 1748  BP:    Pulse: 99 98  Resp: 11 12  Temp:    SpO2: 98% 97%    Last Pain:  Vitals:   07/05/24 1737  TempSrc: Oral  PainSc:    Pain Goal: Patients Stated Pain Goal: 3 (07/04/24 0810)              Epidural/Spinal Function Patient able to flex knees: No (07/05/24 1746), Patient able to lift hips off bed: No (07/05/24 1746), Back pain beyond tenderness at insertion site: No (07/05/24 1746), Progressively worsening motor and/or sensory loss: No (07/05/24 1746), Bowel and/or bladder incontinence post epidural: No (07/05/24 1746)  Rome Ade      "

## 2024-07-05 NOTE — Lactation Note (Addendum)
 This note was copied from a baby's chart. Lactation Consultation Note  Patient Name: Angela Willis Today's Date: 07/05/2024 Age:36 hours LC attempted to see MOB on [facility] however, MOB was not present in the room. RN will set MOB up with DEBP tonight when she returns and advised MOB to pump every 3 hours for 15 minutes. LC Left handout with RN regarding Breast Milk Storage and Collection  and  Cleaning Pump Parts. [facility] NICU team will follow up with MOB in morning.     Maternal Data    Feeding    LATCH Score                    Lactation Tools Discussed/Used    Interventions    Discharge    Consult Status      [name] Batter 07/05/2024, 11:12 PM

## 2024-07-05 NOTE — Op Note (Addendum)
 Operative Report    Pre-Operative Diagnosis: 1) 33+6-week intrauterine pregnancy 2) premature preterm rupture of membranes 3) fetal bradycardia  Postoperative Diagnosis: 1) 33+6-week intrauterine pregnancy 2) premature preterm rupture of membranes 3) fetal bradycardia  Procedure: Emergency classical cesarean section  Surgeon: Dr. Marjorie Gull  Assistant: Dr. Devaughn Coco  Dedicated surgical assistant was required for this case due to its complexity, emergent nature for assistance with retraction and visualization   Antibiotics: Ancef  2 g, azithromycin  500 mg both administered after skin incision  Operative Findings: Female infant in the vertex presentation however due to fetal vertex being deep in the pelvis the fetal feet were identified and the infant was delivered in the double footling breech presentation with Apgar scores of 1 at 1 minute and 8 at 5 minutes.  Birth weight 1710 g, 3 pounds 12.3 ounces.  Specimen: Placenta to pathology  ZAO:[place] I/O In: 1400 [I.V.:1000; IV Piggyback:400] Out: 2388 [Urine:1800; Blood:588]   Procedure:Ms. Harshbarger is an 34 year old gravida 2 para 1011 at 30 weeks and 6 days estimated gestational age who presents for cesarean section.  The patient was initially admitted for premature preterm rupture of membranes on 06/29/2024.  She began to labor on her own early this morning and was transferred to labor and delivery in active labor.  She received an epidural and progressed to completely dilated 1540.  The fetal tracing had been showing deep variable decelerations with a baseline at around 150.  At 1554 the fetal tracing showed a spontaneous deceleration to the 70s which lasted for approximately 3 minutes.  This occurred prior to pushing. The heart rate briefly rebounded to 150 and then decelerated again to the 70s.  Position changes, fluid bolusing and discontinuation of Pitocin  were attempted.  Cervical exam was performed and scalp stim did not show any  improvement therefore the decision was made to proceed for an emergency cesarean delivery for fetal bradycardia. Following the appropriate informed verbal consent the patient was brought to the operating room where epidural anesthesia was administered and found to be adequate.  A Betadine splash was performed.  She was placed in the dorsal supine position with a leftward tilt.  Epidural anesthesia was confirmed to be adequate prior to skin incision.  The scalpel was then used to make a Pfannenstiel skin incision which was carried down to the underlying layers of soft tissue to the fascia. The fascia was incised in the midline and the fascial incision was extended laterally with blunt dissection.  The rectus muscles were separated in the midline with blunt dissection and the peritoneum was identified and entered bluntly and the incision was extended superiorly and inferiorly with blunt dissection. The Alexis retractor was then deployed.  The lower uterine segment was partially developed.  Initially the scalpel was used to make a low transverse incision on the uterus.  An attempt was made to extend the uterine incision with blunt dissection.  The uterine myometrium was thicker than expected.  Initially an attempt was made to deliver the fetal vertex however was deep in the pelvis and could not be easily elevated.  Therefore bandage scissors were used to extend the uterine incision vertically in the midline to form a classical incision and the fetal feet were identified in the fundal portion of the uterus and grasped and delivered through the uterine incision followed by the body.  The cord was clamped and cut and the infant was immediately passed to the waiting neonatal team.  Cord gas was collected.  The placenta  then was spontaneously delivered and the uterus was cleared of all clot debris.  No obvious causes for her sustained fetal bradycardia could be identified.  #1 chromic was used to repair the midline  classical incision in a running locked fashion.  Then the transverse hysterotomy was repaired with #1 chromic in a running locked fashion the midline classical incision was then imbricated with #1 chromic.  The vesicouterine peritoneum was then reapproximated over the low-transverse incision and lower uterine segment due to slight oozing on the surface of the uterus to achieve hemostasis.  The Thersia was then removed.  The peritoneum was identified and repaired with 2-0 Vicryl in a running fashion.  The rectus muscle was reapproximated by 2-0 chromic in a running fashion.  The fascia was closed with oh looped PDS in a running fashion.  The subcutaneous tissue was reapproximated with 2-0 plain gut interrupted sutures the skin was closed with 4-0 Vicryl in a subcuticular fashion and Dermabond.  Honeycomb dressing was placed at the end of the procedure.  Due to inability to perform an adequate preoperative sponge and instrument count abdominal x-rays were performed after completion of the procedure and were negative.  This completed the procedure.  The patient was transferred to the PACU in stable condition following the procedure the baby was transferred to the NICU.  Will continue Ancef  2 g Q8 for 3 doses postpartum due to emergent nature of case,  Betadine splash abdominal prep, and inability to get antibiotics in prior to skin incision.

## 2024-07-06 ENCOUNTER — Encounter (HOSPITAL_COMMUNITY)

## 2024-07-06 LAB — CBC
HCT: 30.5 % — ABNORMAL LOW (ref 36.0–46.0)
Hemoglobin: 10.1 g/dL — ABNORMAL LOW (ref 12.0–15.0)
MCH: 28.9 pg (ref 26.0–34.0)
MCHC: 33.1 g/dL (ref 30.0–36.0)
MCV: 87.1 fL (ref 80.0–100.0)
Platelets: 248 K/uL (ref 150–400)
RBC: 3.5 MIL/uL — ABNORMAL LOW (ref 3.87–5.11)
RDW: 14.1 % (ref 11.5–15.5)
WBC: 16.2 K/uL — ABNORMAL HIGH (ref 4.0–10.5)
nRBC: 0 % (ref 0.0–0.2)

## 2024-07-06 LAB — GLUCOSE, CAPILLARY: Glucose-Capillary: 90 mg/dL (ref 70–99)

## 2024-07-06 MED ORDER — RHO D IMMUNE GLOBULIN 1500 UNIT/2ML IJ SOSY
300.0000 ug | PREFILLED_SYRINGE | Freq: Once | INTRAMUSCULAR | Status: AC
  Administered 2024-07-06: 300 ug via INTRAVENOUS
  Filled 2024-07-06: qty 2

## 2024-07-06 MED ORDER — HYDROMORPHONE HCL 2 MG PO TABS: 2.0000 mg | ORAL_TABLET | ORAL | 0 refills | Status: AC | PRN

## 2024-07-06 MED ORDER — ACETAMINOPHEN 500 MG PO TABS: 1000.0000 mg | ORAL_TABLET | Freq: Four times a day (QID) | ORAL | 3 refills | Status: AC

## 2024-07-06 MED ORDER — ONDANSETRON 4 MG PO TBDP: 4.0000 mg | ORAL_TABLET | Freq: Three times a day (TID) | ORAL | 0 refills | Status: AC | PRN

## 2024-07-06 MED ORDER — IBUPROFEN 600 MG PO TABS: 600.0000 mg | ORAL_TABLET | Freq: Four times a day (QID) | ORAL | 3 refills | Status: AC

## 2024-07-06 MED ORDER — POLYETHYLENE GLYCOL 3350 17 GM/SCOOP PO POWD: 17.0000 g | Freq: Every day | ORAL | Status: AC

## 2024-07-06 NOTE — Progress Notes (Signed)
 Subjective: Postpartum Day 1: Cesarean Delivery Patient reports pain is well controlled - rates 1/10, has some pulling sensation when she ambulates.  Foley removed and has voiding without difficulty. Vaginal bleeding is appropriate. She is ambulating without lightheadedness or dizziness. Tolerating regular diet w/o N/V. Has passed flatus. Is breast pumping.   Baby girl is in the NICU and requires transfer to [facility] for further intervention - Esophageal atresia/tracheal esophageal fistula. Patient strongly desires discharge home today.   Objective: Vital signs in last 24 hours: Temp:  [97.7 F (36.5 C)-98.7 F (37.1 C)] 98 F (36.7 C) (01/10 0723) Pulse Rate:  [68-113] 93 (01/10 0723) Resp:  [11-22] 16 (01/10 0723) BP: (99-148)/(44-88) 129/64 (01/10 0723) SpO2:  [96 %-99 %] 98 % (01/10 0723)  Physical Exam:  General: alert, anxious, tearful, NAD Lochia: appropriate Uterine Fundus: firm, below the umbilicus  Incision: honeycomb in place with no strike through  DVT Evaluation: No evidence of DVT seen on physical exam.  Recent Labs    07/05/24 1924 07/06/24 0506  HGB 11.0* 10.1*  HCT 32.3* 30.5*    Assessment/Plan: Status post Cesarean section. Doing well postoperatively.  Routine post-op: meeting milestones, incision healing well - discussed wound care and to remove bandage by 1/14. 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 A2GDM - AM FSBS within range. Will need 2hr gtt at [facility] visit  Rh negative - infant RH +, will need Rhogam prior to d/c  Contraception - will discuss in further detail at pp visit, considering IUD vs interval BS   Long discussion had with patient regarding discharge after 24hr. Patient is meeting all milestones at this time, but discussed  return precautions including inability to void, no longer passing gas and developing N/V, increased vaginal bleeding resulting in lightheaded/dizziness or uncontrolled pain. Discussed pain regimen - Tylenol  650 mg q6hr scheduled and ibuprofen  600 mg q6 hr scheduled. Patient notes nausea w/ hydrocodone, will prescribe dilaudid  PRN.    Charmaine CHRISTELLA Oz, MD 07/06/2024, 8:38 AM

## 2024-07-06 NOTE — Plan of Care (Signed)
 " Problem: Education: Goal: Knowledge of disease or condition will improve 07/06/2024 1733 by Arlys Alan RAMAN, RN Outcome: Completed/Met 07/06/2024 1005 by Arlys Alan RAMAN, RN Outcome: Progressing Goal: Knowledge of the prescribed therapeutic regimen will improve 07/06/2024 1733 by Arlys Alan RAMAN, RN Outcome: Completed/Met 07/06/2024 1005 by Arlys Alan RAMAN, RN Outcome: Progressing Goal: Individualized Educational Video(s) 07/06/2024 1733 by Arlys Alan RAMAN, RN Outcome: Completed/Met 07/06/2024 1005 by Arlys Alan RAMAN, RN Outcome: Progressing   Problem: Clinical Measurements: Goal: Complications related to the disease process, condition or treatment will be avoided or minimized 07/06/2024 1733 by Arlys Alan RAMAN, RN Outcome: Completed/Met 07/06/2024 1005 by Arlys Alan RAMAN, RN Outcome: Progressing   Problem: Education: Goal: Knowledge of General Education information will improve Description: Including pain rating scale, medication(s)/side effects and non-pharmacologic comfort measures 07/06/2024 1733 by Arlys Alan RAMAN, RN Outcome: Completed/Met 07/06/2024 1005 by Arlys Alan RAMAN, RN Outcome: Progressing   Problem: Health Behavior/Discharge Planning: Goal: Ability to manage health-related needs will improve 07/06/2024 1733 by Arlys Alan RAMAN, RN Outcome: Completed/Met 07/06/2024 1005 by Arlys Alan RAMAN, RN Outcome: Progressing   Problem: Clinical Measurements: Goal: Ability to maintain clinical measurements within normal limits will improve 07/06/2024 1733 by Arlys Alan RAMAN, RN Outcome: Completed/Met 07/06/2024 1005 by Arlys Alan RAMAN, RN Outcome: Progressing Goal: Will remain free from infection 07/06/2024 1733 by Arlys Alan RAMAN, RN Outcome: Completed/Met 07/06/2024 1005 by Arlys Alan RAMAN, RN Outcome: Progressing Goal: Diagnostic test results will improve 07/06/2024 1733 by Arlys Alan RAMAN, RN Outcome: Completed/Met 07/06/2024 1005 by Arlys Alan RAMAN, RN Outcome: Progressing Goal: Respiratory complications  will improve 07/06/2024 1733 by Arlys Alan RAMAN, RN Outcome: Completed/Met 07/06/2024 1005 by Arlys Alan RAMAN, RN Outcome: Progressing Goal: Cardiovascular complication will be avoided 07/06/2024 1733 by Arlys Alan RAMAN, RN Outcome: Completed/Met 07/06/2024 1005 by Arlys Alan RAMAN, RN Outcome: Progressing   Problem: Activity: Goal: Risk for activity intolerance will decrease 07/06/2024 1733 by Arlys Alan RAMAN, RN Outcome: Completed/Met 07/06/2024 1005 by Arlys Alan RAMAN, RN Outcome: Progressing   Problem: Nutrition: Goal: Adequate nutrition will be maintained 07/06/2024 1733 by Arlys Alan RAMAN, RN Outcome: Completed/Met 07/06/2024 1005 by Arlys Alan RAMAN, RN Outcome: Progressing   Problem: Coping: Goal: Level of anxiety will decrease 07/06/2024 1733 by Arlys Alan RAMAN, RN Outcome: Completed/Met 07/06/2024 1005 by Arlys Alan RAMAN, RN Outcome: Progressing   Problem: Elimination: Goal: Will not experience complications related to bowel motility 07/06/2024 1733 by Arlys Alan RAMAN, RN Outcome: Completed/Met 07/06/2024 1005 by Arlys Alan RAMAN, RN Outcome: Progressing Goal: Will not experience complications related to urinary retention 07/06/2024 1733 by Arlys Alan RAMAN, RN Outcome: Completed/Met 07/06/2024 1005 by Arlys Alan RAMAN, RN Outcome: Progressing   Problem: Pain Managment: Goal: General experience of comfort will improve and/or be controlled 07/06/2024 1733 by Arlys Alan RAMAN, RN Outcome: Completed/Met 07/06/2024 1005 by Arlys Alan RAMAN, RN Outcome: Progressing   Problem: Safety: Goal: Ability to remain free from injury will improve 07/06/2024 1733 by Arlys Alan RAMAN, RN Outcome: Completed/Met 07/06/2024 1005 by Arlys Alan RAMAN, RN Outcome: Progressing   Problem: Skin Integrity: Goal: Risk for impaired skin integrity will decrease 07/06/2024 1733 by Arlys Alan RAMAN, RN Outcome: Completed/Met 07/06/2024 1005 by Arlys Alan RAMAN, RN Outcome: Progressing   Problem: Education: Goal: Ability to describe self-care  measures that may prevent or decrease complications (Diabetes Survival Skills Education) will improve 07/06/2024 1733 by Arlys Alan RAMAN, RN Outcome: Completed/Met 07/06/2024 1005 by Arlys Alan RAMAN, RN Outcome: Progressing Goal: Individualized  Educational Video(s) 07/06/2024 1733 by Arlys Alan RAMAN, RN Outcome: Completed/Met 07/06/2024 1005 by Arlys Alan RAMAN, RN Outcome: Progressing   Problem: Coping: Goal: Ability to adjust to condition or change in health will improve 07/06/2024 1733 by Arlys Alan RAMAN, RN Outcome: Completed/Met 07/06/2024 1005 by Arlys Alan RAMAN, RN Outcome: Progressing   Problem: Fluid Volume: Goal: Ability to maintain a balanced intake and output will improve 07/06/2024 1733 by Arlys Alan RAMAN, RN Outcome: Completed/Met 07/06/2024 1005 by Arlys Alan RAMAN, RN Outcome: Progressing   Problem: Health Behavior/Discharge Planning: Goal: Ability to identify and utilize available resources and services will improve 07/06/2024 1733 by Arlys Alan RAMAN, RN Outcome: Completed/Met 07/06/2024 1005 by Arlys Alan RAMAN, RN Outcome: Progressing Goal: Ability to manage health-related needs will improve 07/06/2024 1733 by Arlys Alan RAMAN, RN Outcome: Completed/Met 07/06/2024 1005 by Arlys Alan RAMAN, RN Outcome: Progressing   Problem: Metabolic: Goal: Ability to maintain appropriate glucose levels will improve 07/06/2024 1733 by Arlys Alan RAMAN, RN Outcome: Completed/Met 07/06/2024 1005 by Arlys Alan RAMAN, RN Outcome: Progressing   Problem: Nutritional: Goal: Maintenance of adequate nutrition will improve 07/06/2024 1733 by Arlys Alan RAMAN, RN Outcome: Completed/Met 07/06/2024 1005 by Arlys Alan RAMAN, RN Outcome: Progressing Goal: Progress toward achieving an optimal weight will improve 07/06/2024 1733 by Arlys Alan RAMAN, RN Outcome: Completed/Met 07/06/2024 1005 by Arlys Alan RAMAN, RN Outcome: Progressing   Problem: Skin Integrity: Goal: Risk for impaired skin integrity will decrease 07/06/2024 1733 by Arlys Alan RAMAN, RN Outcome: Completed/Met 07/06/2024 1005 by Arlys Alan RAMAN, RN Outcome: Progressing   Problem: Tissue Perfusion: Goal: Adequacy of tissue perfusion will improve 07/06/2024 1733 by Arlys Alan RAMAN, RN Outcome: Completed/Met 07/06/2024 1005 by Arlys Alan RAMAN, RN Outcome: Progressing   Problem: Education: Goal: Knowledge of Childbirth will improve 07/06/2024 1733 by Arlys Alan RAMAN, RN Outcome: Completed/Met 07/06/2024 1005 by Arlys Alan RAMAN, RN Outcome: Progressing Goal: Ability to make informed decisions regarding treatment and plan of care will improve 07/06/2024 1733 by Arlys Alan RAMAN, RN Outcome: Completed/Met 07/06/2024 1005 by Arlys Alan RAMAN, RN Outcome: Progressing Goal: Ability to state and carry out methods to decrease the pain will improve 07/06/2024 1733 by Arlys Alan RAMAN, RN Outcome: Completed/Met 07/06/2024 1005 by Arlys Alan RAMAN, RN Outcome: Progressing Goal: Individualized Educational Video(s) 07/06/2024 1733 by Arlys Alan RAMAN, RN Outcome: Completed/Met 07/06/2024 1005 by Arlys Alan RAMAN, RN Outcome: Progressing   Problem: Coping: Goal: Ability to verbalize concerns and feelings about labor and delivery will improve 07/06/2024 1733 by Arlys Alan RAMAN, RN Outcome: Completed/Met 07/06/2024 1005 by Arlys Alan RAMAN, RN Outcome: Progressing   Problem: Life Cycle: Goal: Ability to make normal progression through stages of labor will improve 07/06/2024 1733 by Arlys Alan RAMAN, RN Outcome: Completed/Met 07/06/2024 1005 by Arlys Alan RAMAN, RN Outcome: Progressing Goal: Ability to effectively push during vaginal delivery will improve 07/06/2024 1733 by Arlys Alan RAMAN, RN Outcome: Completed/Met 07/06/2024 1005 by Arlys Alan RAMAN, RN Outcome: Progressing   Problem: Role Relationship: Goal: Will demonstrate positive interactions with the child 07/06/2024 1733 by Arlys Alan RAMAN, RN Outcome: Completed/Met 07/06/2024 1005 by Arlys Alan RAMAN, RN Outcome: Progressing   Problem: Safety: Goal: Risk  of complications during labor and delivery will decrease 07/06/2024 1733 by Arlys Alan RAMAN, RN Outcome: Completed/Met 07/06/2024 1005 by Arlys Alan RAMAN, RN Outcome: Progressing   Problem: Pain Management: Goal: Relief or control of pain from uterine contractions will improve 07/06/2024 1733 by Arlys Alan RAMAN, RN Outcome: Completed/Met  07/06/2024 1005 by Arlys Alan RAMAN, RN Outcome: Progressing   Problem: Education: Goal: Knowledge of the prescribed therapeutic regimen will improve 07/06/2024 1733 by Arlys Alan RAMAN, RN Outcome: Completed/Met 07/06/2024 1005 by Arlys Alan RAMAN, RN Outcome: Progressing Goal: Understanding of sexual limitations or changes related to disease process or condition will improve 07/06/2024 1733 by Arlys Alan RAMAN, RN Outcome: Completed/Met 07/06/2024 1005 by Arlys Alan RAMAN, RN Outcome: Progressing Goal: Individualized Educational Video(s) 07/06/2024 1733 by Arlys Alan RAMAN, RN Outcome: Completed/Met 07/06/2024 1005 by Arlys Alan RAMAN, RN Outcome: Progressing   Problem: Self-Concept: Goal: Communication of feelings regarding changes in body function or appearance will improve 07/06/2024 1733 by Arlys Alan RAMAN, RN Outcome: Completed/Met 07/06/2024 1005 by Arlys Alan RAMAN, RN Outcome: Progressing   Problem: Skin Integrity: Goal: Demonstration of wound healing without infection will improve 07/06/2024 1733 by Arlys Alan RAMAN, RN Outcome: Completed/Met 07/06/2024 1005 by Arlys Alan RAMAN, RN Outcome: Progressing   Problem: Education: Goal: Knowledge of condition will improve 07/06/2024 1733 by Arlys Alan RAMAN, RN Outcome: Completed/Met 07/06/2024 1005 by Arlys Alan RAMAN, RN Outcome: Progressing Goal: Individualized Educational Video(s) 07/06/2024 1733 by Arlys Alan RAMAN, RN Outcome: Completed/Met 07/06/2024 1005 by Arlys Alan RAMAN, RN Outcome: Progressing Goal: Individualized Newborn Educational Video(s) 07/06/2024 1733 by Arlys Alan RAMAN, RN Outcome: Completed/Met 07/06/2024 1005 by Arlys Alan RAMAN, RN Outcome: Progressing   "

## 2024-07-06 NOTE — Lactation Note (Signed)
 This note was copied from a Angela'Willis chart.  NICU Lactation Consultation Note  Patient Name: [name] Today'Willis Date: 07/06/2024 Age:36 hours  Reason for consult: Initial assessment; NICU Angela; Late-preterm 34-36.6wks; Maternal endocrine disorder; Maternal discharge; Other (Comment); Infant < 5lbs (AMA, FRG, Trisomy 16 on NIPS) Type of Endocrine Disorder?: Diabetes; PCOS (GDMA2 (glyburide ))  SUBJECTIVE Visited with family of 17 30/54 weeks old AGA NICU female; Angela Willis is a P2 and an experienced pumping, but her first child is now 69 y.o. 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 She'Willis already pumping and getting enough colostrum to collect, praised her for all her efforts.   Reviewed pumping schedule, pump log, secretory activation, lactogenesis III and anticipatory guidelines. Brenner'Willis team in the room to take Angela; MOB got emotional; this LC had to make room but everyone else to transport Angela and left. Angela Willis aware that she needs to continue pumping every 3 hours; 8 times/24 hours to protect her supply. Asked her to call for assistance when needed, she'll be discharged at 4 pm. All questions and concerns answered, family to contact [facility] PRN.  OBJECTIVE Infant data: Mother'Willis Current Feeding Choice: -- (NPO)  O2 Device: HHFNC O2 Flow Rate (L/min): 4 L/min FiO2 (%): 21 %  Infant feeding assessment IDFS - Readiness: 3   Maternal data: [id number] C-Section, Classical Has patient been taught Hand Expression?: Yes Hand Expression Comments: colostrum noted Significant Breast History:: (++) breast changes during the pregnancy Current breast feeding challenges:: NICU admission Previous breastfeeding challenges?: Exclusive pump and bottle fed (1st Angela never latched) Does the patient have breastfeeding experience prior to this  delivery?: Yes How long did the patient breastfeed?: 6 months Pumping frequency: initiated pumping at 10 hours post-partum Pumped volume: 10 mL Flange Size: 21 Risk factor for low/delayed milk supply:: C/Willis, GDMA2, PCOS, AMA, prematurity, < 4 lbs, infant separation  Pump: DEBP, Hands Free, Personal (Multiple pumps at home (Spectra , Medela, Motif))  ASSESSMENT Infant: Feeding Status: NPO  Maternal: Milk volume: Normal  INTERVENTIONS/PLAN Interventions: Interventions: Breast feeding basics reviewed; Coconut oil; DEBP; Education; NICU Pumping Log Discharge Education: Other (comment) (Angela to be transferred to [facility] for [facility] care) Tools: Pump; Flanges; Coconut oil Pump Education: Setup, frequency, and cleaning; Milk Storage  Plan: Consult Status: Complete   Angela Willis Angela Willis 07/06/2024, 1:35 PM

## 2024-07-06 NOTE — Discharge Summary (Signed)
 Postpartum Discharge Summary  Patient Name: Angela Willis DOB: 1990-07-29 MRN: [id number]  Date of admission: 06/28/2024 Delivery date:07/05/2024 Delivering provider: OKEY LEADER Date of discharge: 07/06/2024  Admitting diagnosis: Preterm premature rupture of membranes (PPROM) with unknown onset of labor [O42.919] S/P emergency cesarean section [Z98.891] Intrauterine pregnancy: [redacted]w[redacted]d     Secondary diagnosis:  Principal Problem:   Preterm premature rupture of membranes (PPROM) with unknown onset of labor Active Problems:   Preterm premature rupture of membranes (PPROM) with onset of labor after 24 hours of rupture in third trimester, antepartum   Obesity in pregnancy, antepartum, third trimester   AMA (advanced maternal age) multigravida 35+, third trimester   Abnormal maternal serum screening test   [redacted] weeks gestation of pregnancy   S/P emergency cesarean section  Additional problems: FGR, placental mosaicism (trisomy 27), history of cesarean section, Rh negative, A2GDM      Discharge diagnosis: Preterm Pregnancy Delivered and GDM A2                                              Post partum procedures:rhogam Augmentation: Pitocin  Complications: ROM>24 hours  Hospital course: 34 y.o. yo [id number] at [redacted]w[redacted]d was admitted for PPROM on 06/28/2024. She was admitted to antepartum and received  betamethasone  for fetal lung maturity, MgSo4 for CP prophylaxis, and latency antibiotics. A2GDM was controlled with glyburide . Fetus with known growth restriction and placental mosaicism (trisomy 69). MFM and NICU consutled on admission. She remained stable until AM on 1/9 when she began to labor. Patient attempted TOLAC, however, during pushing developed fetal bradycardia requiring emergent cesarean section.   Delivery details as follows: Membrane Rupture Time/Date: 9:30 PM,06/28/2024  Delivery Method:C-Section, Classical Operative Delivery:N/A Details of operation can be found in separate operative note.    Patient had an uncomplicated postpartum course. 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 Patient strongly desired to be discharged on POD1. She was voiding without difficulty. Pain was well controlled on PO pain medication. Hgb was 12.8 prior to c/s and 10.1 on POD1. She completed 24 hr of Ancef  due to emergent cesarean section with betadine splash. She received Rhogam prior to discharge. She is ambulating,tolerating a regular diet, passing flatus, and urinating well.  Patient is discharged home in stable condition 07/07/2024.  Newborn Data: Birth date:07/05/2024 Birth time:4:15 PM Gender:Female Living status:Living Apgars:1 ,8  Weight:1710 g  Magnesium  Sulfate received: Yes: Neuroprotection BMZ received: Yes Rhophylac :Yes MMR:No T-DaP:Given prenatally Flu: Yes RSV Vaccine received: No Transfusion:No Immunizations administered:  There is no immunization history on file for this patient.  Physical exam  Vitals:   07/06/24 0539 07/06/24 0723 07/06/24 1126 07/06/24 1130  BP: 129/71 129/64 (!) 130/58 (!) 130/58  Pulse: 68 93 86 80  Resp:  16 20   Temp: 98.2 F (36.8 C) 98 F (36.7 C) 98.1 F (36.7 C)   TempSrc: Oral Oral Oral   SpO2: 98% 98% 99%   Weight:      Height:       General: alert, cooperative, and no distress Lochia: appropriate Uterine Fundus: firm Incision: Dressing is clean, dry, and intact DVT Evaluation: No evidence of DVT seen on physical exam. Labs: Lab Results  Component Value Date   WBC 16.2 (H) 07/06/2024   HGB 10.1 (L) 07/06/2024   HCT 30.5 (L) 07/06/2024   MCV 87.1 07/06/2024  PLT 248 07/06/2024      Latest Ref Rng & Units 07/05/2024    7:24 PM  CMP  Creatinine 0.44 - 1.00 mg/dL 9.36    Edinburgh Score:    07/06/2024    8:00 AM  Edinburgh Postnatal Depression Scale Screening Tool  I have been able to laugh and see the funny side of things. 0  I have looked forward with enjoyment to  things. 0  I have blamed myself unnecessarily when things went wrong. 0  I have been anxious or worried for no good reason. 0  I have felt scared or panicky for no good reason. 0  Things have been getting on top of me. 0  I have been so unhappy that I have had difficulty sleeping. 0  I have felt sad or miserable. 0  I have been so unhappy that I have been crying. 0  The thought of harming myself has occurred to me. 0  Edinburgh Postnatal Depression Scale Total 0     After visit meds:  Allergies as of 07/06/2024       Reactions   Hydrocodone Nausea Only   N/V with cough syrup    Tessalon Perles [benzonatate]    N/V         Medication List     STOP taking these medications    aspirin  EC 81 MG tablet       TAKE these medications    acetaminophen  500 MG tablet Commonly known as: TYLENOL  Take 2 tablets (1,000 mg total) by mouth every 6 (six) hours.   [employer] PRENATAL GUMMY PO Take by mouth. Takes 2 gummies once a day   famotidine  20 MG tablet Commonly known as: PEPCID  Take 20 mg by mouth 2 (two) times daily.   Fish Oil + D3 1200-1000 MG-UNIT Caps Take 1 tablet by mouth daily.   HYDROmorphone  2 MG tablet Commonly known as: Dilaudid  Take 1 tablet (2 mg total) by mouth every 4 (four) hours as needed for severe pain (pain score 7-10).   ibuprofen  600 MG tablet Commonly known as: ADVIL  Take 1 tablet (600 mg total) by mouth every 6 (six) hours.   ondansetron  4 MG disintegrating tablet Commonly known as: ZOFRAN -ODT Take 1 tablet (4 mg total) by mouth every 8 (eight) hours as needed for nausea or vomiting.   polyethylene glycol powder 17 GM/SCOOP powder Commonly known as: MiraLax  Take 17 g by mouth daily. Dissolve 1 capful (17g) in 4-8 ounces of liquid and take by mouth daily.        Discharge home in stable condition Infant Feeding: in NICU, mom is breast pumping  Infant Disposition:NICU - transferred to [facility] on 1/10 Discharge instruction: per After Visit  Summary and Postpartum booklet. Activity: Advance as tolerated. Pelvic rest for 6 weeks.  Diet: routine diet Anticipated Birth Control: undecided between IUD or interval LSC BS Postpartum Appointment:6 weeks Additional Postpartum F/U: Postpartum Depression checkup and Incision check 1 week Future Appointments:No future appointments. Follow up Visit:   07/06/2024 Charmaine CHRISTELLA Oz, MD

## 2024-07-06 NOTE — Plan of Care (Signed)
 " Problem: Education: Goal: Knowledge of disease or condition will improve Outcome: Progressing Goal: Knowledge of the prescribed therapeutic regimen will improve Outcome: Progressing Goal: Individualized Educational Video(s) Outcome: Progressing   Problem: Clinical Measurements: Goal: Complications related to the disease process, condition or treatment will be avoided or minimized Outcome: Progressing   Problem: Education: Goal: Knowledge of General Education information will improve Description: Including pain rating scale, medication(s)/side effects and non-pharmacologic comfort measures Outcome: Progressing   Problem: Health Behavior/Discharge Planning: Goal: Ability to manage health-related needs will improve Outcome: Progressing   Problem: Clinical Measurements: Goal: Ability to maintain clinical measurements within normal limits will improve Outcome: Progressing Goal: Will remain free from infection Outcome: Progressing Goal: Diagnostic test results will improve Outcome: Progressing Goal: Respiratory complications will improve Outcome: Progressing Goal: Cardiovascular complication will be avoided Outcome: Progressing   Problem: Activity: Goal: Risk for activity intolerance will decrease Outcome: Progressing   Problem: Nutrition: Goal: Adequate nutrition will be maintained Outcome: Progressing   Problem: Coping: Goal: Level of anxiety will decrease Outcome: Progressing   Problem: Elimination: Goal: Will not experience complications related to bowel motility Outcome: Progressing Goal: Will not experience complications related to urinary retention Outcome: Progressing   Problem: Pain Managment: Goal: General experience of comfort will improve and/or be controlled Outcome: Progressing   Problem: Safety: Goal: Ability to remain free from injury will improve Outcome: Progressing   Problem: Skin Integrity: Goal: Risk for impaired skin integrity will  decrease Outcome: Progressing   Problem: Education: Goal: Ability to describe self-care measures that may prevent or decrease complications (Diabetes Survival Skills Education) will improve Outcome: Progressing Goal: Individualized Educational Video(s) Outcome: Progressing   Problem: Coping: Goal: Ability to adjust to condition or change in health will improve Outcome: Progressing   Problem: Fluid Volume: Goal: Ability to maintain a balanced intake and output will improve Outcome: Progressing   Problem: Health Behavior/Discharge Planning: Goal: Ability to identify and utilize available resources and services will improve Outcome: Progressing Goal: Ability to manage health-related needs will improve Outcome: Progressing   Problem: Metabolic: Goal: Ability to maintain appropriate glucose levels will improve Outcome: Progressing   Problem: Nutritional: Goal: Maintenance of adequate nutrition will improve Outcome: Progressing Goal: Progress toward achieving an optimal weight will improve Outcome: Progressing   Problem: Skin Integrity: Goal: Risk for impaired skin integrity will decrease Outcome: Progressing   Problem: Tissue Perfusion: Goal: Adequacy of tissue perfusion will improve Outcome: Progressing   Problem: Education: Goal: Knowledge of Childbirth will improve Outcome: Progressing Goal: Ability to make informed decisions regarding treatment and plan of care will improve Outcome: Progressing Goal: Ability to state and carry out methods to decrease the pain will improve Outcome: Progressing Goal: Individualized Educational Video(s) Outcome: Progressing   Problem: Coping: Goal: Ability to verbalize concerns and feelings about labor and delivery will improve Outcome: Progressing   Problem: Life Cycle: Goal: Ability to make normal progression through stages of labor will improve Outcome: Progressing Goal: Ability to effectively push during vaginal delivery will  improve Outcome: Progressing   Problem: Role Relationship: Goal: Will demonstrate positive interactions with the child Outcome: Progressing   Problem: Safety: Goal: Risk of complications during labor and delivery will decrease Outcome: Progressing   Problem: Pain Management: Goal: Relief or control of pain from uterine contractions will improve Outcome: Progressing   Problem: Education: Goal: Knowledge of the prescribed therapeutic regimen will improve Outcome: Progressing Goal: Understanding of sexual limitations or changes related to disease process or condition will improve Outcome: Progressing Goal:  Individualized Educational Video(s) Outcome: Progressing   Problem: Self-Concept: Goal: Communication of feelings regarding changes in body function or appearance will improve Outcome: Progressing   Problem: Skin Integrity: Goal: Demonstration of wound healing without infection will improve Outcome: Progressing   Problem: Education: Goal: Knowledge of condition will improve Outcome: Progressing Goal: Individualized Educational Video(s) Outcome: Progressing Goal: Individualized Newborn Educational Video(s) Outcome: Progressing   "

## 2024-07-06 NOTE — Plan of Care (Signed)
 MD bedside - patient reports pain is still well controlled. She ambulated to NICU w/o difficulty. She is breast pumping.  Patient due for 1 more dose of Ancef . 2 g q8hr x 3 dose recommended due to emergent nature of cesarean section and betadine splash. Will receive last dose prior to discharge. Rhogam being prepared by pharmacy and will be given prior to discharge as well.  Patient had no questions.   Charmaine Oz, MD 06/28/2024 4:27 PM

## 2024-07-08 LAB — RH IG WORKUP (INCLUDES ABO/RH)
Fetal Screen: NEGATIVE
Gestational Age(Wks): 33
Unit division: 0

## 2024-07-09 LAB — SURGICAL PATHOLOGY

## 2024-07-17 ENCOUNTER — Telehealth (HOSPITAL_COMMUNITY): Payer: Self-pay | Admitting: *Deleted

## 2024-07-17 NOTE — Telephone Encounter (Signed)
 07/17/2024  Name: Kandi Brusseau MRN: [id number] DOB: 12/20/1990  Reason for Call:  Transition of Care Hospital Discharge Call  Contact Status: Patient Contact Status: Complete  Language assistant needed: Interpreter Mode: Interpreter Not Needed        Follow-Up Questions: Do You Have Any Concerns About Your Health As You Heal From Delivery?: No Do You Have Any Concerns About Your Infants Health?: Infant in NICU  Edinburgh Postnatal Depression Scale:  In the Past 7 Days:    PHQ2-9 Depression Scale:     Discharge Follow-up: Edinburgh score requires follow up?:  (Deferred EPDS, patient took recently at [facility] office, no concerns raised, patient endorses she is doing well emotionally apart from normal worry associated with baby in NICU.) Patient was advised of the following resources:: Support Group, Breastfeeding Support Group  Post-discharge interventions: Reviewed Newborn Safe Sleep Practices  Mliss Sieve, RN 07/17/2024 15:54

## 2024-08-15 ENCOUNTER — Encounter (HOSPITAL_COMMUNITY): Payer: Self-pay

## 2024-08-15 ENCOUNTER — Inpatient Hospital Stay (HOSPITAL_COMMUNITY): Admit: 2024-08-15 | Admitting: Obstetrics

## 2024-08-15 SURGERY — Surgical Case
Anesthesia: Regional
